# Patient Record
Sex: Male | Born: 1990 | Race: Black or African American | Hispanic: No | Marital: Single
Health system: Western US, Academic
[De-identification: ages and names within clinical notes are randomized; demographics above are authoritative.]

## PROBLEM LIST (undated history)

## (undated) DIAGNOSIS — E119 Type 2 diabetes mellitus without complications: Secondary | ICD-10-CM

## (undated) DIAGNOSIS — F32A Depression, unspecified: Secondary | ICD-10-CM

## (undated) DIAGNOSIS — I4891 Unspecified atrial fibrillation: Secondary | ICD-10-CM

## (undated) MED ORDER — ONDANSETRON 4 MG TAB, RAPID DISSOLVE
4 mg | ORAL_TABLET | ORAL | Status: DC
Start: ? — End: 2013-09-11

---

## 2012-02-19 NOTE — ED Provider Notes (Signed)
HPI Comments: 21yoM presents to the ED via EMS C/O Moderate Left Shoulder Pain associated with Left Shoulder Dislocation. Patient states while playing basketball he came down from a Rebound, was coming down made a manevur around his opponent, attempted to break a forward fall with out stretched Left Arm. Patient notes-" I heard a pop and felt my bone go out of place ." EMS reports on their arrival patient was administered 2mg  Morphine, his Shoulder was reduced on scene. On exam patient denies Head Injury or any other complaints, at this time. He is Right Hand Dominant.     Patient is a 21 y.o. male presenting with shoulder pain and shoulder injury. The history is provided by the patient.   Shoulder Pain     Shoulder Injury   Pertinent negatives include no back pain and no neck pain.        History reviewed. No pertinent past medical history.     History reviewed. No pertinent past surgical history.      History reviewed. No pertinent family history.     History     Social History   ??? Marital Status: SINGLE     Spouse Name: N/A     Number of Children: N/A   ??? Years of Education: N/A     Occupational History   ??? Not on file.     Social History Main Topics   ??? Smoking status: Unknown If Ever Smoked   ??? Smokeless tobacco: Not on file   ??? Alcohol Use: No   ??? Drug Use: No   ??? Sexually Active: Yes     Other Topics Concern   ??? Not on file     Social History Narrative   ??? No narrative on file                  ALLERGIES: Review of patient's allergies indicates no known allergies.      Review of Systems   Constitutional: Negative for fever, chills, diaphoresis and unexpected weight change.   HENT: Negative for ear pain, congestion, sore throat, rhinorrhea, drooling, trouble swallowing, neck pain and tinnitus.    Eyes: Negative for photophobia, pain, redness and visual disturbance.   Respiratory: Negative for cough, choking, chest tightness, shortness of breath, wheezing and stridor.    Cardiovascular: Negative for chest pain,  palpitations and leg swelling.   Gastrointestinal: Negative for nausea, vomiting, abdominal pain, diarrhea, constipation, blood in stool, abdominal distention and anal bleeding.   Genitourinary: Negative for dysuria, urgency, frequency, hematuria, flank pain and difficulty urinating.   Musculoskeletal: Positive for arthralgias. Negative for back pain.   Skin: Negative for color change, rash and wound.   Neurological: Negative for dizziness, seizures, syncope, speech difficulty, light-headedness and headaches.   Psychiatric/Behavioral: Negative for suicidal ideas, hallucinations, behavioral problems, self-injury and agitation. The patient is not hyperactive.        Filed Vitals:    02/19/12 1955 02/19/12 2001   BP: 126/62    Pulse: 91    Temp: 98.2 ??F (36.8 ??C)    Resp: 16    Height:  6' (1.829 m)   Weight:  77.111 kg (170 lb)   SpO2: 99%             Physical Exam   Nursing note and vitals reviewed.  Constitutional: He appears well-developed and well-nourished. No distress.        Right hand dominant male without distress.    HENT:   Head: Atraumatic.  Right Ear: External ear normal.   Mouth/Throat: Oropharynx is clear and moist.   Eyes: Conjunctivae and EOM are normal.   Neck: Normal range of motion. Neck supple. No tracheal deviation present.   Cardiovascular: Normal rate, regular rhythm and normal heart sounds.    Pulmonary/Chest: Effort normal and breath sounds normal. No respiratory distress. He has no wheezes. He has no rales. He exhibits no tenderness.   Abdominal: Soft. Bowel sounds are normal. There is no tenderness. There is no rebound and no guarding.   Musculoskeletal: Normal range of motion. He exhibits tenderness. He exhibits no edema.        Left upper arm: He exhibits tenderness (Bi-cep with palpation and Coracoid Process).        Left Clavicle non-tender.   Lymphadenopathy:     He has no cervical adenopathy.   Neurological: He is alert. No cranial nerve deficit. Coordination normal.   Skin: Skin is  warm and dry. No rash noted. He is not diaphoretic.   Psychiatric: He has a normal mood and affect. His behavior is normal.        MDM     Differential Diagnosis; Clinical Impression; Plan:     Mechanism and exam c/w deranged shoulder and likely dislocation, reduced during immobilization by EMS.  Current films w/o bankhart or Hill sachs deformity.  Sling and release, Sick call and SIQ  Risk of Significant Complications, Morbidity, and/or Mortality:   Presenting problems:  Low  Diagnostic procedures:  Low  Management options:  Low      Procedures    -------------------------------------------------------------------------------------------------------------------   RESULTS:   No Visible fracture  Possible Joint dislocation    PROGRESS NOTES:  10:20 PM  Reviewed x-ray findings. Answered patient questions. He is ready to go home.       SCRIBE ATTESTATION STATEMENT:  20:09   Provider documentation written by: Elvis Coil  Acting as a scribe for Dr. Nada Libman, MD ED Provider     I have reviewed the information recorded by the scribe and agree with its contents.    -------------------------------------------------------------------------------------------------------------------  Diagnosis:   1. Shoulder dislocation          Disposition: home      Follow-up Information     Follow up With Details Comments Contact Info    [facility] SICK CALL in 1 day            Patient's Medications   Start Taking    HYDROCODONE-ACETAMINOPHEN (NORCO) 5-325 MG PER TABLET    Take 1-2 tablets PO every 4-6 hours as needed for pain control.  If over the counter ibuprofen or acetaminophen was suggested, then only take the vicodin for pain not well controlled with the over the counter medication.    METHOCARBAMOL (ROBAXIN) 500 MG TABLET    Take 2 Tabs by mouth four (4) times daily.    NAPROXEN (NAPROSYN) 500 MG TABLET    Take 1 Tab by mouth two (2) times daily (with meals) for 10 days.   Continue Taking    No medications on  file   These Medications have changed    No medications on file   Stop Taking    No medications on file

## 2012-02-19 NOTE — ED Notes (Signed)
I have reviewed discharge instructions with the patient.  The patient verbalized understanding.

## 2012-02-19 NOTE — ED Notes (Signed)
Patient fell and landed on left shoulder while playing basketball. Per rescue, patient's shoulder was dislocated but is "back in." Patient continues to c/o pain to left shoulder.

## 2012-02-20 MED ORDER — ONDANSETRON (PF) 4 MG/2 ML INJECTION
4 mg/2 mL | INTRAMUSCULAR | Status: AC
Start: 2012-02-20 — End: 2012-02-19

## 2012-02-20 MED ORDER — NAPROXEN 500 MG TAB
500 mg | ORAL_TABLET | Freq: Two times a day (BID) | ORAL | Status: AC
Start: 2012-02-20 — End: 2012-02-29

## 2012-02-20 MED ORDER — MORPHINE 4 MG/ML SYRINGE
4 mg/mL | Freq: Once | INTRAMUSCULAR | Status: AC
Start: 2012-02-20 — End: 2012-02-19

## 2012-02-20 MED ORDER — METHOCARBAMOL 500 MG TAB
500 mg | ORAL_TABLET | Freq: Four times a day (QID) | ORAL | Status: DC
Start: 2012-02-20 — End: 2013-09-11

## 2012-02-20 MED ORDER — HYDROCODONE-ACETAMINOPHEN 5 MG-325 MG TAB
5-325 mg | ORAL_TABLET | ORAL | Status: DC
Start: 2012-02-20 — End: 2013-09-11

## 2012-02-20 MED ADMIN — ondansetron (ZOFRAN) 4 mg/2 mL injection: INTRAVENOUS | @ 02:00:00 | NDC 00409475503

## 2012-02-20 MED ADMIN — morphine 4 mg/mL injection: INTRAVENOUS | @ 02:00:00 | NDC 00409189101

## 2012-02-20 MED FILL — MORPHINE 4 MG/ML SYRINGE: 4 mg/mL | INTRAMUSCULAR | Qty: 1

## 2012-02-20 MED FILL — ONDANSETRON (PF) 4 MG/2 ML INJECTION: 4 mg/2 mL | INTRAMUSCULAR | Qty: 2

## 2012-05-03 MED ADMIN — ondansetron (ZOFRAN ODT) tablet 8 mg: ORAL | @ 12:00:00 | NDC 68462015840

## 2012-05-03 MED ADMIN — famotidine (PEPCID) tablet 20 mg: ORAL | @ 12:00:00 | NDC 68084017211

## 2012-05-03 MED FILL — FAMOTIDINE 20 MG TAB: 20 mg | ORAL | Qty: 1

## 2012-05-03 MED FILL — ONDANSETRON 8 MG TAB, RAPID DISSOLVE: 8 mg | ORAL | Qty: 1

## 2012-05-03 NOTE — ED Provider Notes (Signed)
HPI Comments: The patient is a 21 year old male who states he ate [place] food last night, and about an hour later developed nausea, vomiting, slight diarrhea and generalized abdominal discomfort.  He believes this is from the food.  No fevers.  No blood in stool or vomit.  He vomited "maybe 3 times."  No other complaints.     Patient is a 21 y.o. male presenting with vomiting and diarrhea. The history is provided by the patient.   Vomiting   Associated symptoms include diarrhea. Pertinent negatives include no fever and no arthralgias.   Diarrhea   Associated symptoms include diarrhea and vomiting. Pertinent negatives include no fever and no arthralgias.        History reviewed. No pertinent past medical history.     History reviewed. No pertinent past surgical history.      History reviewed. No pertinent family history.     History     Social History   ??? Marital Status: SINGLE     Spouse Name: N/A     Number of Children: N/A   ??? Years of Education: N/A     Occupational History   ??? Not on file.     Social History Main Topics   ??? Smoking status: Unknown If Ever Smoked   ??? Smokeless tobacco: Not on file   ??? Alcohol Use: No   ??? Drug Use: No   ??? Sexually Active: Yes     Other Topics Concern   ??? Not on file     Social History Narrative   ??? No narrative on file                  ALLERGIES: Review of patient's allergies indicates no known allergies.      Review of Systems   Constitutional: Negative for fever.   HENT: Negative for facial swelling.    Eyes: Negative for visual disturbance.   Respiratory: Negative for apnea.    Gastrointestinal: Positive for vomiting and diarrhea.        As above.    Musculoskeletal: Negative for arthralgias.   Skin: Negative for wound.   Neurological: Negative for syncope.   Psychiatric/Behavioral: Negative for confusion.       Filed Vitals:    05/03/12 0722   BP: 119/75   Pulse: 63   Temp: 98.3 ??F (36.8 ??C)   Resp: 16   Height: 5\' 11"  (1.803 m)   Weight: 74.844 kg (165 lb)   SpO2: 99%             Physical Exam   Nursing note and vitals reviewed.  Constitutional: He appears well-developed and well-nourished. No distress.        Looks comfortable, non toxic, well hydrated, answering questions appropriately.    HENT:   Head: Normocephalic and atraumatic.   Eyes: Conjunctivae are normal.   Neck: Neck supple.   Cardiovascular: Normal rate.    Pulmonary/Chest: Effort normal. No stridor.   Abdominal: Soft. Bowel sounds are normal. He exhibits no distension. There is no tenderness. There is no rebound and no guarding.   Musculoskeletal: Normal range of motion.   Neurological: He is alert.   Skin: Skin is warm and dry. He is not diaphoretic.   Psychiatric: He has a normal mood and affect. His behavior is normal. Judgment and thought content normal.        MDM     Differential Diagnosis; Clinical Impression; Plan:     21 year old  male with ? Food related gastritis.  Benign exam and vitals.   Risk of Significant Complications, Morbidity, and/or Mortality:   Presenting problems:  Low  Diagnostic procedures:  Low  Management options:  Low      Procedures    He is feeling better.  His abdomen remains soft and benign.  Vitals normal.  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  We discussed worsening symptoms and the need to return here for any worsening.  He states he understands.

## 2012-05-03 NOTE — ED Provider Notes (Signed)
I was personally available for consultation in the emergency department. I have reviewed the chart prior to the patient's discharge and agree with the documentation recorded by the MLP, including the assessment, treatment plan, and disposition.

## 2012-05-03 NOTE — ED Notes (Signed)
N/v/d since 0330. Patient states ate chinese food last pm.

## 2012-05-03 NOTE — ED Notes (Signed)
Pt resting at this time. Pt c/o n/v.

## 2012-05-03 NOTE — ED Notes (Signed)
I have reviewed discharge instructions with the patient.  The patient verbalized understanding.

## 2013-09-11 MED ORDER — LIDOCAINE-EPINEPHRINE 1 %-1:100,000 IJ SOLN
1 %-:00,000 | Freq: Once | INTRAMUSCULAR | Status: AC
Start: 2013-09-11 — End: 2013-09-11
  Administered 2013-09-11: 22:00:00 via INTRADERMAL

## 2013-09-11 MED ORDER — ONDANSETRON (PF) 4 MG/2 ML INJECTION
4 mg/2 mL | INTRAMUSCULAR | Status: AC
Start: 2013-09-11 — End: 2013-09-11
  Administered 2013-09-11: 22:00:00 via INTRAVENOUS

## 2013-09-11 MED ORDER — HYDROCODONE-ACETAMINOPHEN 5 MG-325 MG TAB
5-325 mg | ORAL_TABLET | ORAL | Status: DC | PRN
Start: 2013-09-11 — End: 2014-02-11

## 2013-09-11 MED ORDER — HYDROMORPHONE (PF) 1 MG/ML IJ SOLN
1 mg/mL | Freq: Once | INTRAMUSCULAR | Status: AC
Start: 2013-09-11 — End: 2013-09-11
  Administered 2013-09-11: 22:00:00 via INTRAVENOUS

## 2013-09-11 MED ORDER — HYDROMORPHONE (PF) 1 MG/ML IJ SOLN
1 mg/mL | Freq: Once | INTRAMUSCULAR | Status: AC
Start: 2013-09-11 — End: 2013-09-11
  Administered 2013-09-11: 23:00:00 via INTRAVENOUS

## 2013-09-11 MED FILL — ONDANSETRON (PF) 4 MG/2 ML INJECTION: 4 mg/2 mL | INTRAMUSCULAR | Qty: 2

## 2013-09-11 MED FILL — LIDOCAINE-EPINEPHRINE 1 %-1:100,000 IJ SOLN: 1 %-:00,000 | INTRAMUSCULAR | Qty: 20

## 2013-09-11 MED FILL — HYDROMORPHONE (PF) 1 MG/ML IJ SOLN: 1 mg/mL | INTRAMUSCULAR | Qty: 1

## 2013-09-11 NOTE — ED Notes (Signed)
I have reviewed discharge instructions with the patient.  The patient verbalized understanding.

## 2013-09-11 NOTE — ED Provider Notes (Signed)
HPI Comments: Franklin Hughes is a 23 y.o. Male who has a PMHx of shoulder dislocations, presents to the ED c/o a left shoulder injury, which occurred while playing basketball just prior to arrival.  The patient c/o sharp left shoulder pain and rated it an 8/10.  The patient has dislocated his shoulder on three prior occasions.    The history is provided by the patient.        Past Medical History   Diagnosis Date   ??? Shoulder dislocation         Past Surgical History   Procedure Laterality Date   ??? Hx fracture tx           History reviewed. No pertinent family history.     History     Social History   ??? Marital Status: SINGLE     Spouse Name: N/A     Number of Children: N/A   ??? Years of Education: N/A     Occupational History   ??? Not on file.     Social History Main Topics   ??? Smoking status: Unknown If Ever Smoked   ??? Smokeless tobacco: Not on file   ??? Alcohol Use: No   ??? Drug Use: No   ??? Sexual Activity: Yes     Other Topics Concern   ??? Not on file     Social History Narrative          ALLERGIES: Review of patient's allergies indicates no known allergies.      Review of Systems   Constitutional: Negative.  Negative for fever, chills and diaphoresis.   HENT: Negative.  Negative for congestion, ear pain, sore throat and trouble swallowing.    Eyes: Negative.  Negative for photophobia, pain, redness and visual disturbance.   Respiratory: Negative.  Negative for cough, chest tightness, shortness of breath and wheezing.    Cardiovascular: Negative.  Negative for chest pain and palpitations.   Gastrointestinal: Negative.  Negative for nausea, vomiting, abdominal pain, diarrhea and blood in stool.   Genitourinary: Negative for dysuria and frequency.   Musculoskeletal: Positive for arthralgias (In left Shoulder). Negative for back pain, joint swelling and neck pain.   Skin: Negative.    Neurological: Negative.  Negative for seizures, syncope and headaches.   Psychiatric/Behavioral: Negative.  Negative for behavioral  problems. The patient is not nervous/anxious.        Filed Vitals:    09/11/13 1609 09/11/13 1611   BP:  126/76   Pulse: 80    Temp: 98.2 ??F (36.8 ??C)    Resp: 18    Height: 5\' 10"  (1.778 m)    Weight: 73.029 kg (161 lb)    SpO2: 98%             Physical Exam   Constitutional: He is oriented to person, place, and time. He appears well-developed and well-nourished. No distress.   HENT:   Head: Normocephalic and atraumatic.   Mouth/Throat: Oropharynx is clear and moist.   Eyes: Conjunctivae are normal. Pupils are equal, round, and reactive to light. No scleral icterus.   Neck: Normal range of motion. Neck supple. No tracheal deviation present.   Cardiovascular: Normal rate, normal heart sounds and intact distal pulses.    Pulmonary/Chest: Effort normal and breath sounds normal. No respiratory distress. He has no wheezes.   Abdominal: Soft. Bowel sounds are normal. He exhibits no distension. There is no tenderness.   Musculoskeletal:        Left  shoulder: He exhibits decreased range of motion (Secondary to pain) and tenderness (To palpation).   Anterior fullness consistent with a dislocation.   Lymphadenopathy:     He has no cervical adenopathy.   Neurological: He is alert and oriented to person, place, and time. No cranial nerve deficit.   Left shoulder is neurovascularly intact.   Skin: Skin is warm and dry. He is not diaphoretic.   Psychiatric: He has a normal mood and affect.   Nursing note and vitals reviewed.       MDM  Number of Diagnoses or Management Options  Shoulder dislocation, left, initial encounter:      Amount and/or Complexity of Data Reviewed  Tests in the radiology section of CPT??: ordered and reviewed    Risk of Complications, Morbidity, and/or Mortality  Presenting problems: high  Diagnostic procedures: high  Management options: high  General comments: After reduction pt nv intact    Patient Progress  Patient progress: stable      Reduction of Joint  Consent: Verbal consent obtained.  Consent given  by: patient  Date/Time: 09/11/2013 4:35 PM  Performed by: attendingPre-proc eval:Immediately prior to the procedure, the patient was reevaluated and found suitable for the planned procedure and any planned medications.  Timeout: Immediately prior to the procedure a time out was called to verify the correct patient, procedure, equipment, staff and marking as appropriate.  Dislocation reduced: left shoulder  Reduction method: traction and counter traction  Post-reduction assessment: distal perfusion intact and neurologic function intact  Procedure: Successful and Confirmed by x-ray  Patient tolerance: Patient tolerated the procedure well with no immediate complications  My total time at bedside, performing this procedure was 1-15 minutes.        -------------------------------------------------------------------------------------------------------------------     EKG INTERPRETATIONS:    NONE    RADIOLOGY RESULTS:   XR SHOULDER LT AP/LAT MIN 2 V      Pre reduction: x-ray shows left shoulder dislocation.    Post Reduction: x-ray shows successful reduction of left shoulder.      ORDERS  Orders Placed This Encounter   ??? JOINT/FRACTURE REDUCTION   ??? XR SHOULDER LT AP/LAT MIN 2 V   ??? XR SHOULDER LT AP/LAT MIN 2 V   ??? SLING & SWATHE   ??? lidocaine-EPINEPHrine (XYLOCAINE) 1 %-1:100,000 injection 100 mg   ??? HYDROmorphone (PF) (DILAUDID) injection 1 mg   ??? ondansetron (ZOFRAN) injection 4 mg   ??? HYDROmorphone (PF) (DILAUDID) injection 0.5 mg   ??? HYDROcodone-acetaminophen (NORCO) 5-325 mg per tablet           LAB RESULTS:   No results found for this or any previous visit (from the past 12 hour(s)).        CONSULTATIONS:    NONE    PROGRESS NOTES:  4:08 PM  Dr. Doristine MangoFrank A Airianna Kreischer, MD reviewed treatment plan with patient. Reviewed medications i.e. bottles provided. Answered his questions.     4:41 PM   Dr. Doristine MangoFrank A Aldine Grainger, MD reduced the patients left shoulder back into place.    5:17 PM  Dr. Doristine MangoFrank A Aletha Allebach, MD let patient know he  was discharging him to his home and discussed medications.    5:28 PM  Nurse put a shoulder immobilizer on patient and they are neurovascularly intact.  Patient is stable and ready for discharge.        ED DIAGNOSES & DISPOSITIONS:   Diagnosis:   1. Shoulder dislocation, left, initial encounter  Disposition: Home    Follow-up Information    Follow up With Details Comments Contact Info    [place] R Manke, MD Call Call on Monday to schedule a follow up appointment. [address]                          c  [facility]  [place] [address]  [phone]            Current Discharge Medication List      START taking these medications    Details   HYDROcodone-acetaminophen (NORCO) 5-325 mg per tablet Take 1 Tab by mouth every four (4) hours as needed for Pain. Max Daily Amount: 6 Tabs.  Qty: 20 Tab, Refills: 0                 SCRIBE ATTESTATION STATEMENT  Documented by: Bishop Limbo (4:08 PM), scribing for and in the presence of Doristine Mango, MD. (4:08 PM)          PROVIDER ATTESTATION STATEMENT  I personally performed the services described in the documentation, reviewed the documentation, as recorded by the scribe in my presence, and it accurately and completely records my words and actions.  Doristine Mango, MD. 6:07 PM    -------------------------------------------------------------------------------------------------------------------

## 2013-09-11 NOTE — ED Notes (Signed)
Shoulder reduced and immobilizer applied. Pt tolerated well.  VSS.  Awaiting paitent ride then discharge.

## 2013-09-11 NOTE — ED Notes (Signed)
Patient arrives via rescue for c/o left shoulder injury s/p hyperextending while playing basketball.  Pt has hx of shoulder dislocation several times in past.

## 2013-09-13 NOTE — ED Provider Notes (Signed)
[facility]  EMER[facility]MENT REPORT  NAME:  Pati GalloJENKINS, Anel  SEX:   M  ADMIT: 09/13/2013  DOB:   [birth year]  MR#    [id number]  ROOM:    TIME DICTATED: 05 34 AM  ACCT#  [id number]        TIME OF SERVICE:  0350    CHIEF COMPLAINT:  Shoulder dislocation.    HISTORY OF PRESENT ILLNESS:  The patient is a 23 year old male who presents complaining of dislocation to  his left shoulder which occurred spontaneously approximately 30 minutes prior  to arrival.  The patient states he awoke from his sleep with severe pain in   his  left shoulder.  He states this happened 3 days ago when he was diagnosed with  left shoulder dislocation at a different ER.  At that time, he had it reset   and  was told to follow up with orthopedics whom he plans to call tomorrow to  schedule an appointment with.  The patient denies any trauma or injury to his  left shoulder.    REVIEW OF SYSTEMS:  CONSTITUTIONAL:  No fever or chills.  MUSCULOSKELETAL:  Left shoulder pain with history of dislocation.  NEUROLOGIC:  Denies sensory or motor symptoms.  CARDIOVASCULAR:  Denies chest pain.    RESPIRATORY:  Denies any shortness of breath.  GASTROINTESTINAL:  Denies nausea, vomiting, abdominal pain.  EARS/NOSE/THROAT:  Denies sore throat or URI symptoms.  SKIN:  Denies rash.  EYES:  No visual symptoms.   HEMATOLOGIC:  No bleeding or bruising issues.  GENITOURINARY:  No dysuria, frequency or urgency.    PAST MEDICAL HISTORY:  History of left shoulder dislocation.    CURRENT MEDICATIONS:  None.    ALLERGIES:  NO KNOWN DRUG ALLERGIES.    SOCIAL HISTORY:  The patient currently uses tobacco.  Admits occasional alcohol use.  Denies  illicit drug use.    FAMILY HISTORY:  Noncontributory.    PHYSICAL EXAMINATION:  VITAL SIGNS:  Blood pressure 117/60, pulse 76, respirations 16, temperature  98.4 orally, pain 9 out of 10, O2 saturation 99% on room air.  GENERAL APPEARANCE:  The patient appears well developed, well nourished.  He   is  alert.   RESPIRATORY:  Clear and equal breath sounds.  No respiratory distress,  tachypnea or accessory muscle use.  CARDIOVASCULAR:  Heart regular without murmurs, gallops, rubs or thrills.  GASTROINTESTINAL:  Abdomen soft, nondistended, nontender to palpation.  HEENT:  Mouth/Throat:  Surfaces of the pharynx, palate, and tongue are pink,  moist, and without lesions.      NECK:  Supple, nontender, symmetrical, no masses or JVD, trachea midline,  thyroid not enlarged, nodular or tender.  LYMPHATIC:  No cervical or submandibular lymphadenopathy palpated.   MUSCULOSKELETAL:  Diffuse tenderness to palpation of left shoulder with   obvious  deformity.  No bony tenderness over the left elbow, left wrist or the left  hand.  Full range of motion of joints are intact.  Radial pulse 2+.  Nailbeds  are pink, prompt capillary refill.  Very limited range of motion of the left  shoulder joint due to patient's complaint of pain.  SKIN:  Area over left upper extremity warm, dry, intact.  No evidence of rash,  erythema or edema.  NEUROLOGIC:  Grip strength and light touch sensation of upper extremities  intact and symmetric bilaterally.    INITIAL IMPRESSION:  This is a 23 year old male here for evaluation of left shoulder pain with  obvious deformity and recent history of spontaneous shoulder dislocation.  We  will obtain x-rays.  Otherwise neurovascularly service.  We will establish IV  and medicate patient symptomatically for his pain.    CONTINUED BY Clayvon Parlett Consuella Lose, MD:      INITIAL ASSESSMENT:    A 23 year old male presents to the ER today with shoulder dislocation, x-ray  with confirmation.  We are going to perform conscious sedation and reduce the  shoulder.    COURSE IN THE EMERGENCY DEPARTMENT:  The patient was premedicated with propofol 80 mg in divided doses.  He was  previously medicated with 6 mg morphine for pain.  Using traction,  counter-traction after appropriate sedation the patient's shoulder was   relocated without difficulty.  This was confirmed with postprocedural x-ray.   The patient tolerated the procedure well.  Post procedural sedation total time  less than 10 minutes.    DIAGNOSIS:  Shoulder dislocation status post reduction.      The patient was discharged home in stable condition.      ___________________  Candace Cruise MD  Dictated By: Truddie Crumble Cathlyn Parsons, PA-C    My signature above authenticates this document and my orders, the final  diagnosis (es), discharge prescription (s), and instructions in the [facility]  Pul[facility]ck record.  Nursing notes have been reviewed by the physician/mid-level provider.    If you have any questions please contact [phone].      D:09/13/2013 05:34:55  T: 09/13/2013 16:48:54  [id number]  Authenticated and Edited by Lyman Speller Luciano Cutter, M.D. On 09/17/13 11:48:33 AM

## 2013-09-15 NOTE — ED Provider Notes (Signed)
[facility]  EMERGENCY DEPARTMENT TREATMENT REPORT  NAME:  Franklin GalloJENKINS, Willem  SEX:   M  ADMIT: 09/14/2013  DOB:   1990-11-25  MR#    [id number]  ROOM:    TIME DICTATED: 08 03 PM  ACCT#  [id number]        TIME OF EVALUATION:  1904    CHIEF COMPLAINT:    I don't have my medication.    HISTORY OF PRESENT ILLNESS:  This 23 year old male who has a history of left shoulder dislocation was seen   here early on 02/09 after dislocated in his sleep.  It was reduced here.  Has   been in a sling.  He states he presented his paperwork and medications and   everything to his command and they apparently left today, taking his Percocet   prescription with them.  They will not be back for several days and he is in   pain.  He denies any interim injury or trauma.  He already has an appointment   to follow up with command on Friday for further evaluation of his left   shoulder.    REVIEW OF SYSTEMS:  CONSTITUTIONAL:  No fever, chills, or weight loss.  ENT:  No sore throat, runny nose, or other URI symptoms.  RESPIRATORY:  No cough, shortness of breath, or wheezing.  CARDIOVASCULAR:  No chest pain, chest pressure, or palpitations.  GASTROINTESTINAL:  No vomiting, diarrhea, or abdominal pain.  MUSCULOSKELETAL:  Positive for left shoulder pain.  NEUROLOGICAL:  No headaches, sensory or motor symptoms.  Denies complaints in all other systems.    PAST MEDICAL HISTORY:  History of shoulder dislocation going back 2013 when he was playing   basketball.  He has had several dislocation since that time.  Most recently   was seen here yesterday to have it reduced.    MEDICATIONS:  He was given a Percocet prescription which he does not have.    ALLERGIES:  NONE KNOWN.    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    PHYSICAL EXAMINATION:  GENERAL:  A well-developed male.  VITAL SIGNS:  Blood pressure 112/78, pulse 73, respiration 14, temperature   99.4, O2 sats 98% on room air.  LUNGS:  Clear to auscultation.   HEART:  Has a regular rate and rhythm.  EXTREMITIES:  His left shoulder has a sling on.  The shoulder appears to be in   place.  That extremity is well perfused, warm and dry.  He has good radial   pulse, good capillary refill.  Sensation is intact.  He is able to wiggle his   fingers.  Right upper extremity is atraumatic.  Lower extremities warm and   dry.    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  At this   time, we will refill Percocet prescription.  I did discuss that we would not   be able to do this in the future.  I have given ibuprofen, have him use   ibuprofen as well.  Follow up with his Command as planned for further   evaluation of the shoulder.  Certainly seek medical attention if worsening or   new concerns.    FINAL DIAGNOSES:  Medication refill, history of left shoulder dislocation.    DISPOSITION AND PLAN:  The patient  discharged home in stable condition to follow up  as above.  The   patient was examined by myself and Dr. Carolin Guernsey, who agrees with   the above   assessment and plan.      ___________________  Liberty Handy Himmel-Madelline Eshbach DO  Dictated By: Maurice Small. Williams Che, [place]    My signature above authenticates this document and my orders, the final  diagnosis (es), discharge prescription (s), and instructions in the [facility]   Pulsecheck record.  Nursing notes have been reviewed by the physician/mid-level provider.    If you have any questions please contact [phone].    MLT  D:09/14/2013 20:03:34  T: 09/15/2013 09:31:02  [id number]  Authenticated by Carolin Guernsey, DO On 09/18/2013 05:05:32 PM

## 2014-02-11 MED ORDER — MIDAZOLAM 1 MG/ML IJ SOLN
1 mg/mL | INTRAMUSCULAR | Status: AC
Start: 2014-02-11 — End: 2014-02-11
  Administered 2014-02-11: via INTRAVENOUS

## 2014-02-11 MED ORDER — FENTANYL CITRATE (PF) 50 MCG/ML IJ SOLN
50 mcg/mL | Freq: Once | INTRAMUSCULAR | Status: AC
Start: 2014-02-11 — End: 2014-02-11
  Administered 2014-02-11: via INTRAVENOUS

## 2014-02-11 MED ORDER — MORPHINE 4 MG/ML SYRINGE
4 mg/mL | INTRAMUSCULAR | Status: AC
Start: 2014-02-11 — End: 2014-02-11
  Administered 2014-02-11: 23:00:00 via INTRAVENOUS

## 2014-02-11 MED ADMIN — sodium chloride 0.9 % bolus infusion 1,000 mL: INTRAVENOUS | @ 23:00:00 | NDC 00409798309

## 2014-02-11 MED FILL — SODIUM CHLORIDE 0.9 % IV: INTRAVENOUS | Qty: 1000

## 2014-02-11 MED FILL — MORPHINE 4 MG/ML SYRINGE: 4 mg/mL | INTRAMUSCULAR | Qty: 1

## 2014-02-11 NOTE — ED Notes (Signed)
Pt slightly sleepy but awake and back at his baseline, talking with girlfriend at bedside. Currently on room air; vitals are stable; see flowsheet. Shoulder immobilizer remains in place. Will continue to closely monitor pt. Call light within reach.

## 2014-02-11 NOTE — ED Notes (Signed)
Pt states he was playing basketball when someone came down on his left arm dislocating his left shoulder. Obvious deformity noted to left shoulder.  Pt given 10 mg Morphine IV by EMS

## 2014-02-11 NOTE — ED Notes (Signed)
Took report from The TJX CompaniesCharmaine RN, now assuming care of pt at this time. Pt relaxing in stretcher, reports pain 8/10. MD here now to see pt. Provided pt w/ pillow for further support/comfort of his shoulder. Awaiting XR.

## 2014-02-11 NOTE — ED Notes (Signed)
Pt taken to x-ray via stretcher.

## 2014-02-11 NOTE — ED Provider Notes (Signed)
HPI Comments: Franklin Hughes is a 23 y.o. Male with a Hx of Left shoulder dislocations who was brought in by EMS to the ED complaining of desolation to left shoulder that occurred just prior to arrival after playing basket ball and having his arm hit while reaching up. Relates his pain to previous dislocations and there is an obvious deformity to the shoulder. Denies numbness, tingling or any other injuries. Pt given 10 mg Morphine IV by EMS and states his pain is returning. He states that he is having surgery on his shoulder in 5 days with Dr Celine Ahr.     Patient is a 23 y.o. male presenting with shoulder injury. The history is provided by the patient and the EMS personnel.   Shoulder Injury   The problem has not changed since onset.Pertinent negatives include no numbness, no back pain and no neck pain.        Past Medical History   Diagnosis Date   ??? Shoulder dislocation    ??? Shoulder dislocation, recurrent      left side         Past Surgical History   Procedure Laterality Date   ??? Hx fracture tx           History reviewed. No pertinent family history.     History     Social History   ??? Marital Status: SINGLE     Spouse Name: N/A     Number of Children: N/A   ??? Years of Education: N/A     Occupational History   ??? Not on file.     Social History Main Topics   ??? Smoking status: Current Every Day Smoker -- 0.25 packs/day   ??? Smokeless tobacco: Not on file   ??? Alcohol Use: Yes      Comment: social ly   ??? Drug Use: No   ??? Sexual Activity: Yes     Other Topics Concern   ??? Not on file     Social History Narrative                  ALLERGIES: Review of patient's allergies indicates no known allergies.      Review of Systems   Constitutional: Negative for fever and chills.   Respiratory: Negative for chest tightness and shortness of breath.    Cardiovascular: Negative for chest pain and palpitations.   Gastrointestinal: Negative for nausea, vomiting, diarrhea and constipation.    Musculoskeletal: Negative for back pain and neck pain.        Left shoulder pain   Neurological: Negative for weakness and numbness.   All other systems reviewed and are negative.      Filed Vitals:    02/11/14 2115 02/11/14 2116 02/11/14 2130 02/11/14 2145   BP: 109/72 109/72 108/64 102/66   Pulse: 70 63 77 79   Temp:       Resp: 15 18 15 14    Height:       Weight:       SpO2: 100% 100% 99% 98%            Physical Exam   Constitutional: He is oriented to person, place, and time. He appears well-developed and well-nourished. He appears distressed.   HENT:   Head: Normocephalic and atraumatic.   Mouth/Throat: Oropharynx is clear and moist.   Eyes: Conjunctivae are normal.   Neck: Full passive range of motion without pain. Neck supple. No tracheal deviation present.   Cardiovascular: Normal  rate, regular rhythm, normal heart sounds and intact distal pulses.    Pulmonary/Chest: Effort normal and breath sounds normal. No respiratory distress. He has no wheezes.   Abdominal: Bowel sounds are normal.   Musculoskeletal: Normal range of motion. He exhibits no edema.   Obvious deformity to left shoulder. Good strength, sensation and cap refill in left hand. Equal radial pulses.    Neurological: He is alert and oriented to person, place, and time. No cranial nerve deficit or sensory deficit.   Skin: Skin is warm, dry and intact. He is not diaphoretic.   Psychiatric: He has a normal mood and affect.   Nursing note and vitals reviewed.       MDM  Number of Diagnoses or Management Options  Shoulder dislocation, left, initial encounter:   Diagnosis management comments: Dislocated left shoulder    Pt scheduled for ortho surgery for recurrent dislocation    Will do reduction moderated sedation    9:20PM  Pt awake, feels much better, VSS, on monitor    10:31PM  Pt sitting up talking with girlfriend who will take him home. Pt AOx4, VSS.     Will dc home with Rx norco, motrin, is in sling.     Pre and post sling: NV intact     Told him keep surgery appt without fail. He agrees.        Amount and/or Complexity of Data Reviewed  Tests in the radiology section of CPT??: ordered and reviewed  Tests in the medicine section of CPT??: ordered and reviewed  Review and summarize past medical records: yes    Risk of Complications, Morbidity, and/or Mortality  Presenting problems: moderate  Diagnostic procedures: moderate  Management options: high    Critical Care  Total time providing critical care: < 30 minutes    Patient Progress  Patient progress: improved      Reduction of Joint  Consent: Verbal consent obtained. Written consent obtained.  Consent given by: patient  Date/Time: 02/11/2014 8:35 PM  Performed by: attendingPre-proc eval:Immediately prior to the procedure, the patient was reevaluated and found suitable for the planned procedure and any planned medications.  Timeout: Immediately prior to the procedure a time out was called to verify the correct patient, procedure, equipment, staff and marking as appropriate.  Dislocation reduced: left shoulder  Reduction method: direct traction, external rotation and traction and counter traction (counter traction)  Post-reduction assessment: neurologic function intact, distal perfusion intact, neurologic function unchanged and distal perfusion unchanged  Procedure: Successful and Confirmed by x-ray  Patient tolerance: Patient tolerated the procedure well with no immediate complications  My total time at bedside, performing this procedure was 1-15 minutes.  Procedural Sedation  Consent: Verbal consent obtained.  Consent given by: patient  Date/Time: 02/11/2014 8:35 PM  Performed by: attendingPre-procedure re-eval: Immediately prior to the procedure, the patient was reevaluated and found suitable for the planned procedure and any planned medications.  Time out: Immediately prior to the procedure a time out was called to verify the correct patient, procedure, equipment, staff and marking as appropriate..   Indications:joint reduction  Expected sedation level:level 2-1: moderate/analgesia (conscious sedation)  Asa classification: 1 - normal healthy patient  Mallampati score: I - soft palate, uvula, fauces, tonsillar pillars visible  Airway: Can hyperextend neck and maintain airway  Sedation monitoring: heart rate, cardiac monitor, continuous pulse ox and IV access  Patient status: sedated  Preoxygenation: nasal cannula  Pre-treatment meds: fentanyl  Sedation: fentanylSedation response: vital signs stable, O2 sats  greater than 92% and airway patent  Complications: no complications  Post procedure status: sleepy and responds to verbal stimuli  Patient tolerance: Patient tolerated the procedure well with no immediate complications  My total time at bedside, performing this procedure was 1-15 minutes.        PROGRESS NOTES  7:11 PM: Ewing Schlein, DO arrives to the bedside to evaluate the patient. Answered the patient's questions regarding the treatment plan.  10:00 PM: Pt is feeling much better and is instructed to keep his arm immobilizied in the splint.       CONSULTATIONS  None    ED PHYSICIAN ORDERS  Orders Placed This Encounter   ??? JOINT/FRACTURE REDUCTION     This order was created via procedure documentation     Standing Status: Standing      Number of Occurrences: 1      Standing Expiration Date:    ??? MODERATE SEDATION     This order was created via procedure documentation     Standing Status: Standing      Number of Occurrences: 1      Standing Expiration Date:    ??? XR SHOULDER LT AP/LAT MIN 2 V     Standing Status: Standing      Number of Occurrences: 1      Standing Expiration Date:      Order Specific Question:  Transport     Answer:  Doctor, general practice [5]     Order Specific Question:  Reason for Exam     Answer:  Pain   ??? XR SHOULDER LT AP/LAT MIN 2 V     Standing Status: Standing      Number of Occurrences: 1      Standing Expiration Date:      Order Specific Question:  Transport     Answer:  No Transport [3]      Order Specific Question:  Reason for Exam     Answer:  post-reduction   ??? CARDIAC MONITORING     Standing Status: Standing      Number of Occurrences: 1      Standing Expiration Date:      Order Specific Question:  Type:     Answer:  Bedside   ??? SALINE LOCK IV ONE TIME STAT     Standing Status: Standing      Number of Occurrences: 1      Standing Expiration Date:    ??? morphine injection 4 mg     Sig:    ??? sodium chloride 0.9 % bolus infusion 1,000 mL     Sig:    ??? fentaNYL citrate (PF) injection 150 mcg     Sig:    ??? midazolam (VERSED) injection 4 mg     Sig:    ??? naloxone (NARCAN) 1 mg/mL injection     Sig:      DEKRONE, DIANE: cabinet override   ??? flumazenil (ROMAZICON) 0.1 mg/mL injection     Sig:      DEKRONE, DIANE: cabinet override   ??? HYDROcodone-acetaminophen (NORCO) 5-325 mg per tablet     Sig: Take 1 Tab by mouth every six (6) hours as needed for Pain. Max Daily Amount: 4 Tabs.     Dispense:  14 Tab     Refill:  0   ??? ibuprofen (MOTRIN) 800 mg tablet     Sig: Take 1 Tab by mouth every six (6) hours as needed for Pain.     Dispense:  20 Tab     Refill:  0         MEDICATIONS ORDERED  Medications   naloxone (NARCAN) 1 mg/mL injection (0 mg  Held 02/11/14 2000)   flumazenil (ROMAZICON) 0.1 mg/mL injection (  Held 02/11/14 2000)   morphine injection 4 mg (4 mg IntraVENous Given 02/11/14 1903)   sodium chloride 0.9 % bolus infusion 1,000 mL (0 mL IntraVENous IV Completed 02/11/14 1928)   fentaNYL citrate (PF) injection 150 mcg (100 mcg IntraVENous Given 02/11/14 1950)   midazolam (VERSED) injection 4 mg (2 mg IntraVENous Given 02/11/14 1950)         RADIOLOGY INTERPRETATIONS  XR SHOULDER LT AP/LAT MIN 2 V        Impression Read by Dr Kristeen Mansews:  Left shoulder anterior dislocation    Status Post Reduction:  Shoulder is in correct alignment no fracture present.            EKG READINGS/LABORATORY RESULTS  No results found for this or any previous visit (from the past 12 hour(s)).       ED DIAGNOSIS & DISPOSITION INFORMATION  Diagnosis:   1. Shoulder dislocation, left, initial encounter          Disposition: Discharged    Follow-up Information    Follow up With Details Comments Contact Info    Thomasene Lothristopher Ingard Ellingson, MD  Keep your appointment with Dr Celine AhrEllingson without fail.  [address]  [place] [id number]  [phone]            Current Discharge Medication List      START taking these medications    Details   HYDROcodone-acetaminophen (NORCO) 5-325 mg per tablet Take 1 Tab by mouth every six (6) hours as needed for Pain. Max Daily Amount: 4 Tabs.  Qty: 14 Tab, Refills: 0      ibuprofen (MOTRIN) 800 mg tablet Take 1 Tab by mouth every six (6) hours as needed for Pain.  Qty: 20 Tab, Refills: 0               SCRIBE ATTESTATION STATEMENT  Documented by: Linard MillersKelsey Boyd, scribing for and in the presence of Ewing SchleinAdrian C Kathlyne Loud, DO. (7:11 PM)    PROVIDER ATTESTATION STATEMENT  I personally performed the services described in the documentation, reviewed the documentation, as recorded by the scribe in my presence, and it accurately and completely records my words and actions.  Truett MainlandAdrian C Naydelin Ziegler, DO.

## 2014-02-11 NOTE — ED Notes (Signed)
Bedside and Verbal shift change report given to Ericka,RN (Cabin crew) by Gala Romney (offgoing nurse). Report included the following information SBAR, ED Summary and MAR.

## 2014-02-11 NOTE — ED Notes (Signed)
Patient discharged with ride home. Ambulatory without difficulty. Alert and oriented x 3. Respirations unlabored. Skin warm and dry. I have reviewed discharge instructions with the patient and spouse.  The patient and spouse verbalized understanding.

## 2014-02-12 MED ORDER — IBUPROFEN 800 MG TAB
800 mg | ORAL_TABLET | Freq: Four times a day (QID) | ORAL | Status: DC | PRN
Start: 2014-02-12 — End: 2014-02-11

## 2014-02-12 MED ORDER — HYDROCODONE-ACETAMINOPHEN 5 MG-325 MG TAB
5-325 mg | ORAL_TABLET | Freq: Four times a day (QID) | ORAL | Status: DC | PRN
Start: 2014-02-12 — End: 2014-02-11

## 2014-02-12 MED ORDER — OXYCODONE-ACETAMINOPHEN 5 MG-325 MG TAB
5-325 mg | ORAL_TABLET | Freq: Four times a day (QID) | ORAL | Status: AC | PRN
Start: 2014-02-12 — End: ?

## 2014-02-12 MED FILL — NALOXONE 1 MG/ML IJ SYRG(AKA NARCAN): 1 mg/mL | INTRAMUSCULAR | Qty: 2

## 2014-02-12 MED FILL — MIDAZOLAM 1 MG/ML IJ SOLN: 1 mg/mL | INTRAMUSCULAR | Qty: 4

## 2014-02-12 MED FILL — FENTANYL (PF) 100 MCG/2 ML (50 MCG/ML) INTRAVENOUS SYRINGE: 100 mcg/2 mL (50 mcg/mL) | INTRAVENOUS | Qty: 4

## 2018-04-21 ENCOUNTER — Emergency Department
Admission: EM | Admit: 2018-04-21 | Discharge: 2018-04-21 | Disposition: A | Discharge status: Home / Self Care | Payer: Self-pay | Attending: Emergency Medicine | Admitting: Emergency Medicine

## 2018-04-21 DIAGNOSIS — H7392 Unspecified disorder of tympanic membrane, left ear: Secondary | ICD-10-CM | POA: Insufficient documentation

## 2018-04-21 DIAGNOSIS — H9202 Otalgia, left ear: Principal | ICD-10-CM | POA: Insufficient documentation

## 2018-04-21 DIAGNOSIS — H9212 Otorrhea, left ear: Secondary | ICD-10-CM | POA: Insufficient documentation

## 2018-04-21 NOTE — ED Notes (Signed)
Pt given discharge instructions and verbalized understanding with all questions answered prior to discharge. Pt aox4, ambulatory with steady gait. No distress noted. Vitals stable upon discharge.

## 2018-04-21 NOTE — ED Notes (Signed)
Dr Corbett at bedside

## 2018-04-21 NOTE — Discharge Instructions (Signed)
Please make a follow-up appointment with the ear nose and throat specialist to have your ear further evaluated.       Otalgia, NOS    You have been seen today for ear pain.    The medical term for ear pain is "otalgia." The cause of your ear pain does not seem to be a typical ear infection. When examining your ear, the doctor did not see the usual eardrum redness or other signs often seen with an ear infection.    The exact cause of your ear pain is not known at this time. However, the doctor who cared for you today feels it is OK for you to go home.    Some causes of ear pain (aside from infection) are:   Eustachian Tube Dysfunction: The eustachian tube connects the sinuses with the middle ear. It serves to equalize (balance) pressure. The eustachian tube can get swollen and blocked off, usually from an upper respiratory infection (a head cold). When this happens, air may get trapped behind the eardrum, causing pressure and pain. Sometimes medicines like oral decongestants like pseudoephedrine (Sudafed) and nasal sprays like phenylephrine (Neo-Synephrine) or oxymetazoline (Afrin) can help shrink the swelling and relieve pressure.   Referred Pain: Because of the way the nerves in the head are arranged, the pain you feel in your ear may be coming from another place. Dental pain from cavities or tooth infections may be felt in the ear. Pain that starts in the jaw joint (the temporo-mandibular joint or "TMJ" may be felt in the ear. Head and neck infections may cause a lymph node (bumps found in the body that are part of the immune system) near the ear to get swollen and painful. Sore throats (from strep throat, "mono" and other throat infections) may cause pain to travel to the ear.   Tumors: A rare, but serious, cause of ear pain and pressure may be from a tumor (mass) pressing on the nerves that give feeling to the head and face. Symptoms from tumors often start slowly and get worse with time. They may also  cause pain or loss of function in other parts of the face. If the ear pain does not go away or gets worse over the next week, get checked again.    It is possible you have an infection but it was too early to be diagnosed during your visit. If you develop more ear infection symptoms, get checked again by your regular doctor or here or at the nearest Emergency Department.   Some middle ear infection (otitis media) symptoms are: Fever (temperature higher than 100.62F / 38C) and ear pain. There may also be a feeling of fullness in the ear or decreased hearing. If the eardrum ruptures (splits), there may also be drainage from the ear.   Some symptoms of an external ear infection affecting the ear canal only (otitis externa) are: Fullness in the ear, itching, ear pain and cheesy drainage. There may be tenderness (pain) when the ear is pulled.   It is VERY IMPORTANT to follow up with your doctor. This is to have additional testing. It is also to have another examination to try to find out the cause of your pain.    YOU SHOULD SEEK MEDICAL ATTENTION IMMEDIATELY, EITHER HERE OR AT THE NEAREST EMERGENCY DEPARTMENT, IF ANY OF THE FOLLOWING OCCURS:   You do not get better, despite treatment.   Symptoms become worse.   A severe headache, stiff neck, lethargy (excessive fatigue and  sleepiness) or confusion develops.

## 2018-04-21 NOTE — ED Notes (Signed)
Pt c/o left ear pain x1-2wks, no illness or fevers reported. Pt denies trauma. Pt reports has seasonal congestion. Pt aox4 ambulatory with steady gait.

## 2018-04-21 NOTE — ED Provider Notes (Signed)
Emergency Department Provider Note    Christopher [name]  MRN: [id number]  DOB: 1990-10-21    The Date of Service for the Emergency Room encounter is 04/21/2018  8:15 PM     History obtained from patient  Chief Complaint:  Chief Complaint   Patient presents with   . Ear Pain     left ear pain and "fullness" x2wks. pt denies fevers, illness or trauma.       HPI:  Christopher [name] is a 27 year old male w/ no reported past medical history presents with left ear pain. Patient reports initially had right ear pain and fullness, went to outpatient hospital, had a hydrogen peroxide and irrigation, large amount a wax removed and patient reports symptoms improved.  Two weeks ago started having left ear fullness.  Again went to the hospital, at this point did not have any hydrogen peroxide but was irrigated and then physician attempted scraping wax out of the ear.  Patient reports he had sudden onset of pain at that time.  Patient reports today he has had difficulty hearing, intermittent fullness when he yawns.  Did notice some brown drainage from the ear.  No fevers or chills, no significant pain. Has not been on any abx. Was told his TM looked "weird" but not to be concerned and referred to ENT but has not made appt yet.         Patient's medical history has been reviewed today as available in EPIC chart.    ROS:   Constitutional: No Fever  Eyes: No vision changes, No discharge  ENT:  No sore throat, + Lear fullness  Cardiovascular: No Chest pain  Respiratory: No cough, No SOB   GI: No abd pain, No nausea, No vomiting  MSK: No joint pain   Skin: No Rash   Neuro: No headache       Home Medications:  None       Allergies:   Patient has no known allergies.    Past Medical History:   No past medical history on file.    Past Surgical History:   No past surgical history on file.    Family History:  No family history on file.    Social History:   Denies drug use    PHYSICAL EXAM  Vitals:    04/21/18 2020 04/21/18 2029   BP:  (!) 178/106    Pulse: 100    Resp: 20    Temp: 98.5 F (36.9 C)    SpO2: 99%    Weight:  181.4 kg (400 lb)   Height: 6\' 1"  (1.854 m)      Vital Signs reviewed  SpO2 measured to be 99% and interpreted as wnl    Gen: alert, non-toxic appearing,  NAD, very well appearing  HEENT:Atraumatic, EOMI, MMM, L TM with scant brown drainage,  No mastoid ttp, no pain with ear manipulation, TM defect but without clear perforation  Neck: full ROM   CV: Reg rate  Pulm:  non-labored breathing   Back:no CVA tenderness  MSK: MAEx4  Neuro: Alert, gait normal, no ataxia, CN 2-12 intact        Medical Decision-Making & ED Course  27 year old male w./ unremarkable pmh p/w L ear fullness that is intermittent. Pt is very well appearing, exam as noted above, no clear s/s of infection including no e/o mastoiditis, otitis externa/media, however TM does have a defect, unclear if this could have occurred during prior  irrigation of ear. Ear canal does look irritated. Recommend f/u with ENT, referral provided. S/s of infection discuss for which pt voiced understanding, will return if these symptoms develop.     Plan:  Ent referral  Return precautions     Patient seen and discussed with ED attending, Corbett, Gerrit Halls, *.            Blima Singer, MD  Resident  04/23/18 1005       Corbett, Gerrit Halls, MD  04/30/18 1150

## 2018-04-22 ENCOUNTER — Telehealth: Payer: Self-pay

## 2018-04-22 NOTE — Telephone Encounter (Signed)
Per pt mom want to schedule an appt today if possible from a STAT referral, he got discharge yesterday from the ED . Pt does not have insurance. Mom Is aware of the self pay fee and facility fee if in [facility].    Diagnosis : L eye pain, abrnormal TM    Please assist thank you  [phone]

## 2018-04-23 NOTE — Telephone Encounter (Signed)
I called the patient over the phone. He reports he went to the ER for left ear pain, and was not given any treatment. He was then told to follow up with ENT specialist to further assess his TM. The patient also reports he feels as though his left ear is "stuffed up" and has experiences some mild decreased hearing. This morning he noticed some drainage on the his pillow case on the left side, unsure if this was ear wax.     I advise the patient he will need to be scheduled for an audiogram and a consultation with one of our ENTs. Patient verbalized understanding. He was offered an audiogram appointment tomorrow at 01:00 PM, and possibly an appointment in clinic. Patient is in the process of applying for [employer] and mentions he will call our office back to confirm his availability for tomorrow.

## 2018-04-23 NOTE — Telephone Encounter (Signed)
Yes absolutely needs audiogram

## 2018-04-29 NOTE — Telephone Encounter (Signed)
Message forwarded to [place], [place] to assit with scheduling patient an appointment with audiology and evaluation.

## 2018-05-18 NOTE — Telephone Encounter (Signed)
Called patient twice, phone went straight to voicemail. Left message to schedule audiogram.

## 2020-04-11 IMAGING — MR MRI SHOULDER LT WO CONTRAST
4 series · 34 of 40 positions shown · non-contrast
Comparison: None available.

INDICATION: Instability of left shoulder joint.
TECHNIQUE: Multiplanar, multisequence MR imaging of the left shoulder without contrast.

[Series 5: t2_axial_fs · axial · left · 3.0mm · 0.47mm/px · z∈[+1,+97]mm · 9 of 25 slices shown]
[im 1/25]
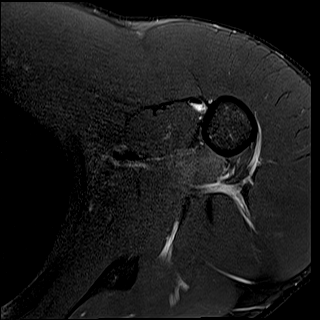
[im 4/25]
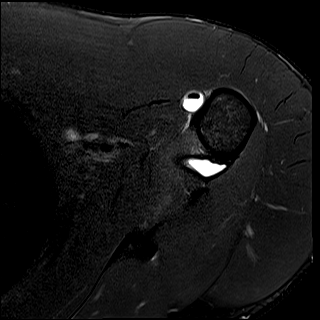
[im 7/25]
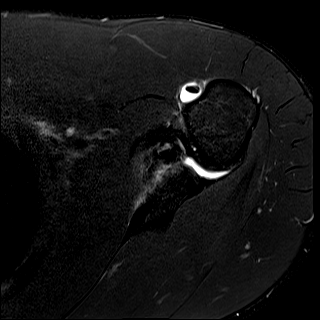
[im 10/25]
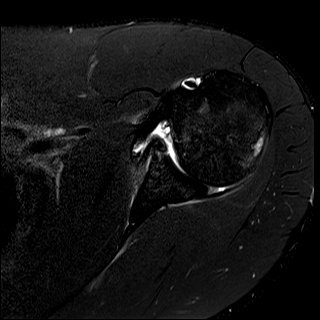
[im 13/25]
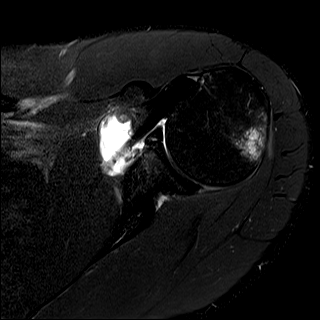
[im 16/25]
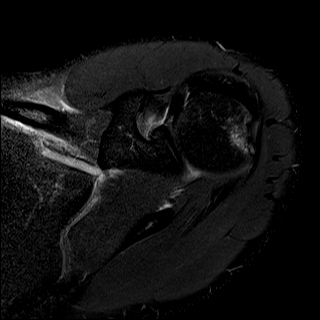
[im 19/25]
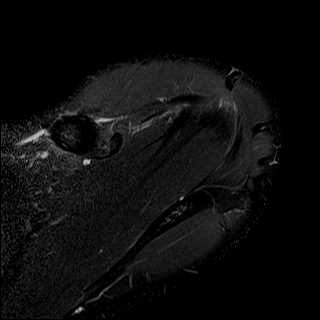
[im 22/25]
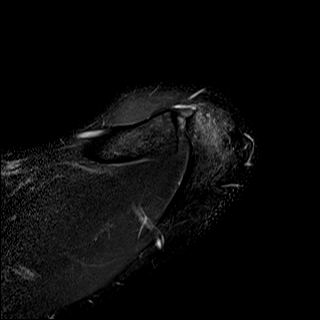
[im 25/25]
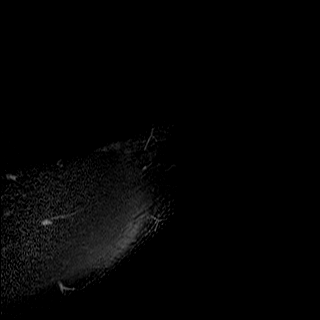

[Series 6: t2_cor_fs · coronal · left · 3.0mm · 0.50mm/px · 9 of 25 slices shown]
[im 1/25]
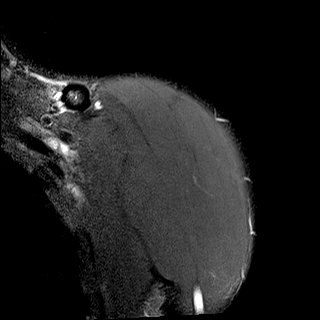
[im 4/25]
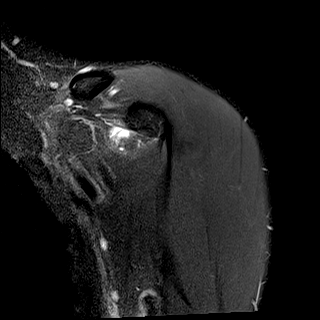
[im 7/25]
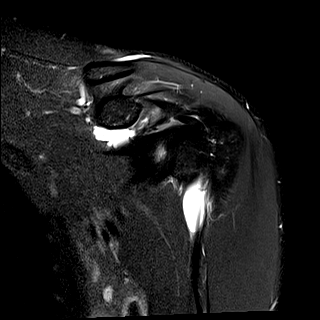
[im 10/25]
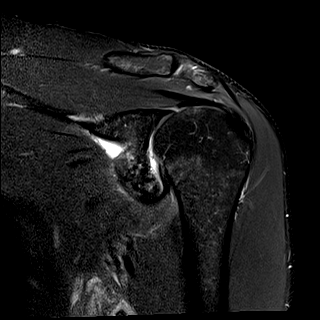
[im 13/25]
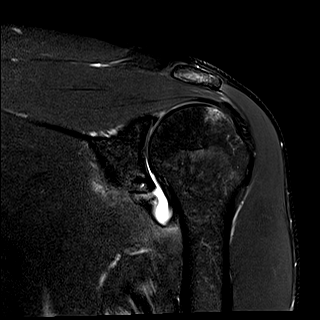
[im 16/25]
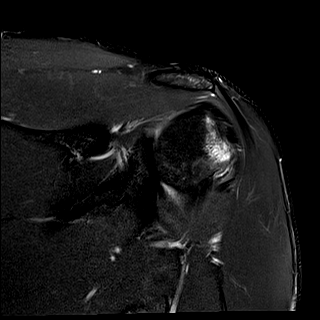
[im 19/25]
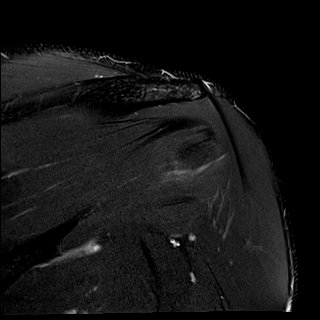
[im 22/25]
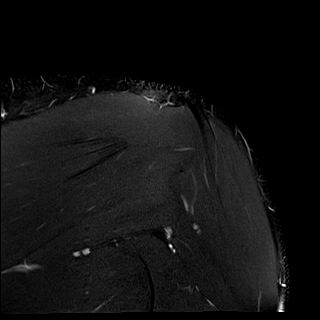
[im 25/25]
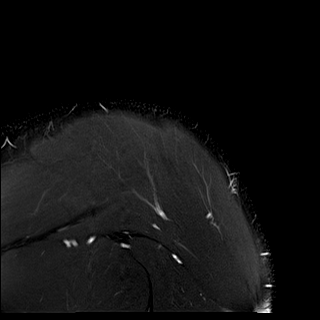

[Series 8: t1_sag · sagittal · left · 3.0mm · 0.47mm/px · 11 of 28 slices shown]
[im 1/28]
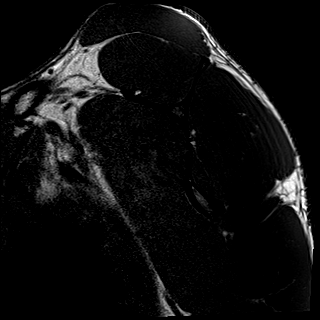
[im 3/28]
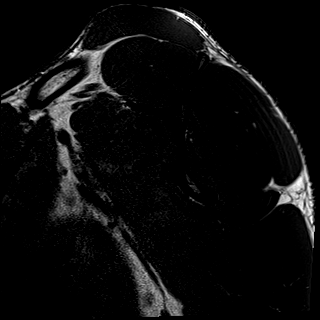
[im 6/28]
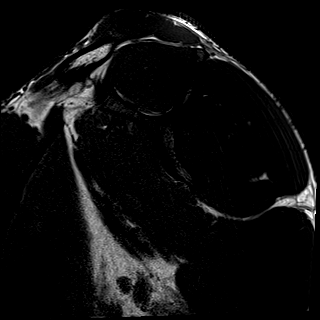
[im 9/28]
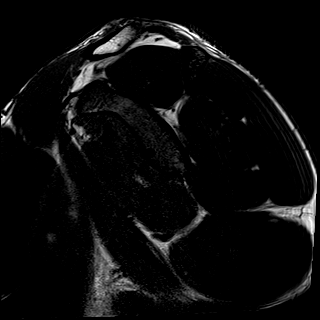
[im 11/28]
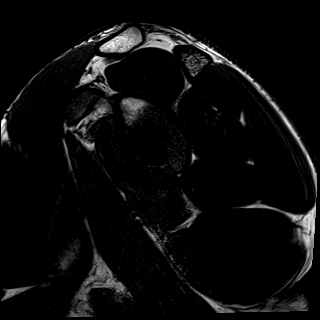
[im 14/28]
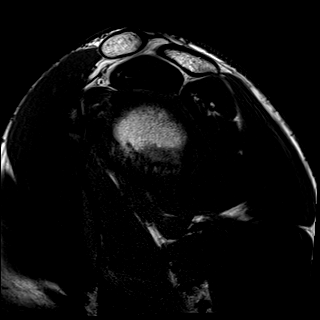
[im 17/28]
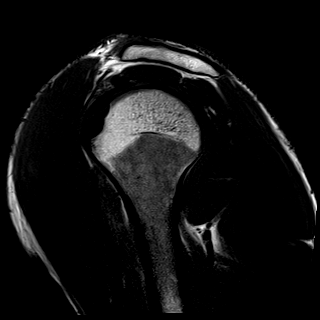
[im 19/28]
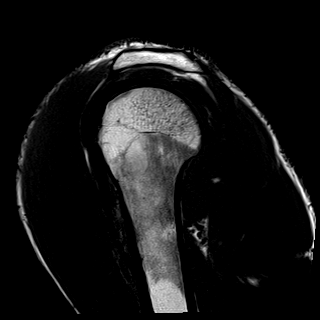
[im 22/28]
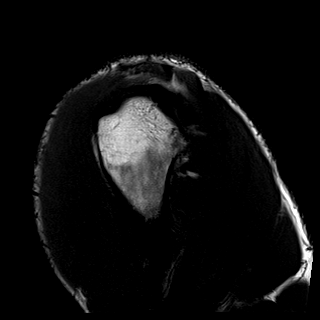
[im 25/28]
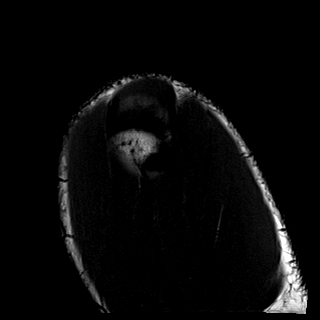
[im 28/28]
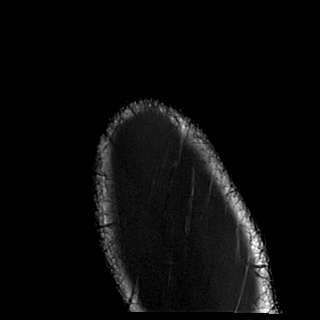

[Series 9: t2_sag_fs · sagittal · left · 3.0mm · 0.31mm/px · 5 of 28 slices shown]
[im 1/28]
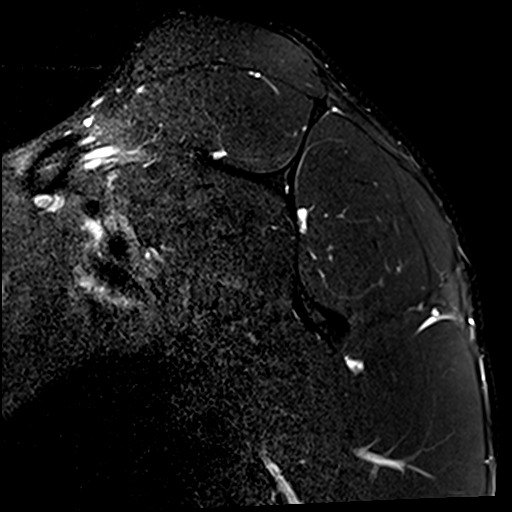
[im 3/28]
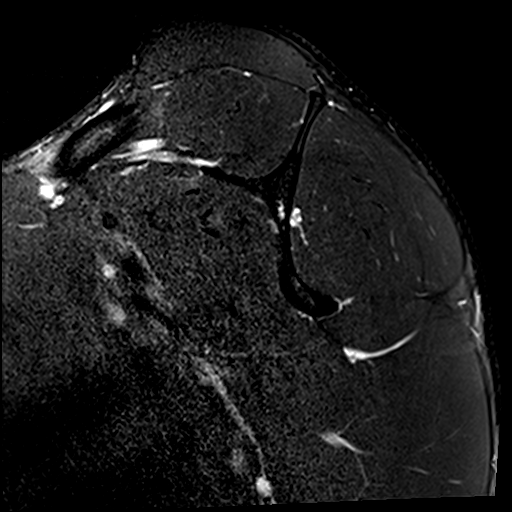
[im 6/28]
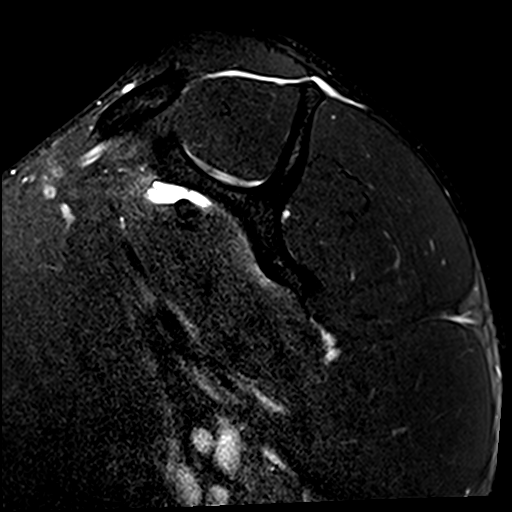
[im 14/28]
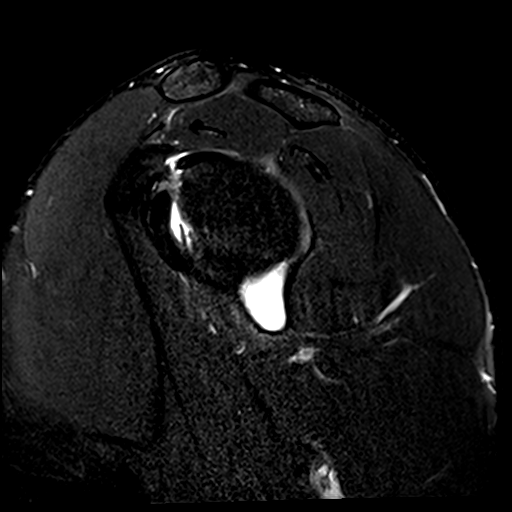
[im 25/28]
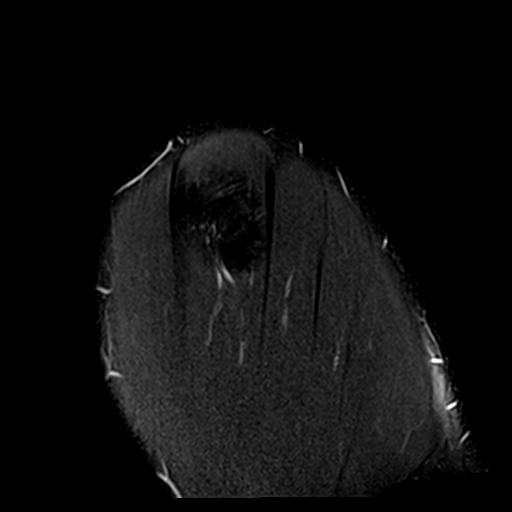

[34 of 40 positions shown; findings below may reference images not displayed]

FINDINGS: OSSEOUS: Impaction fracture of the posterolateral aspect of the humeral head measuring 22 mm, with moderate underlying bone marrow edema. Mild bone marrow edema is present within the acromion. A small age-indeterminate nondisplaced fracture of the anterior inferior glenoid is present (series 5, image 16).

ACROMIAL OUTLET: The acromioclavicular joint is unremarkable in appearance. The acromion is nonhooked. The coracoacromial clavicular and coracoacromial ligaments are intact. No significant fluid within the subacromial/subdeltoid bursa.

ROTATOR CUFF:

Supraspinatus and infraspinatus: Intact.

Teres minor: Intact.

Subscapularis: Intact.

Fatty atrophy: The rotator cuff muscles are maintained.

LABRUM: Evidence of prior labral repair with 2 suture anchors within the anteroinferior glenoid. Ovoid morphology of the anterior inferior glenoid, compatible with prior labral repair. Increased signal intensity extending near the chondral labral junction of the anterior inferior glenoid is favored to represent a nondisplaced fracture of the anterior inferior glenoid, as opposed to a tear through the chondrolabral junction.

BICEPS TENDON: The proximal intra-articular and extra-articular long head biceps tendon are intact.

GLENOHUMERAL JOINT: The glenohumeral joint is normal in alignment. Full-thickness chondrosis along the fracture of the anterior inferior glenoid. The glenohumeral articular cartilage is otherwise maintained.

OTHER: High-grade partial-thickness tearing/stripping of the inferior glenohumeral ligament, at the glenoid origin. Moderate glenohumeral joint effusion. 10 x 5 mm region of nodular intermediate signal interposed between the anterior inferior labrum and the humeral head, likely representing synovitis and/or scar.
IMPRESSION: 1.
Evidence of recent anterior shoulder dislocation, with impaction fracture of the posterolateral humeral head, and suspected small age-indeterminate fracture nondisplaced through the anterior inferior glenoid. Further evaluation with noncontrast CT of the left shoulder could be performed to further evaluate the glenoid fracture.

2.
Evidence of prior anterior inferior labral repair, with abnormal morphology of the anteroinferior glenoid, likely representing postsurgical change. Increased signal intensity near the chondral labral junction is favored to represent a fracture through the anteroinferior glenoid as opposed to a tear through the chondrolabral junction.

3.
High-grade partial-thickness tearing/stripping of the inferior glenohumeral ligament, at the glenoid origin.

STAT fax

## 2023-03-11 IMAGING — MR MRI ANKLE LT WO CONTRAST
4 of 6 series · 15 of 40 positions shown · non-contrast
Comparison: None available.

Images Obtained from [facility]
INDICATION: Pain in left ankle and joints of left foot, Strain of left Achilles tendon, initial encounter,
TECHNIQUE: Multiplanar, multisequence imaging of the left ankle/hindfoot was performed without contrast.

[Series 3: t2_sag_fs · sagittal · left · 3.0mm · 0.31mm/px · 4 of 22 slices shown]
[im 1/22]
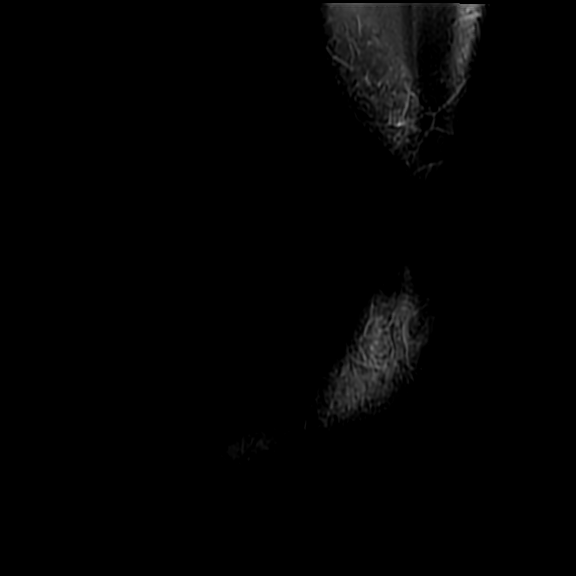
[im 8/22]
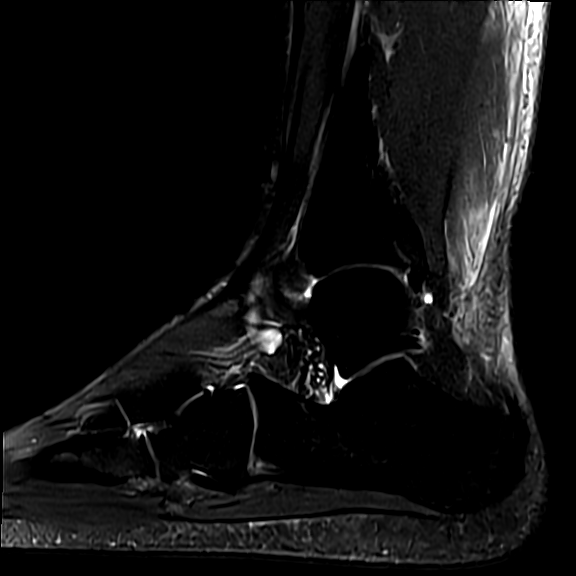
[im 15/22]
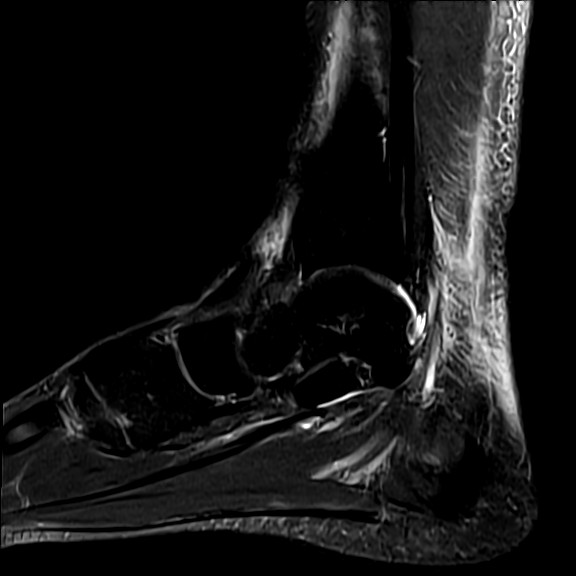
[im 22/22]
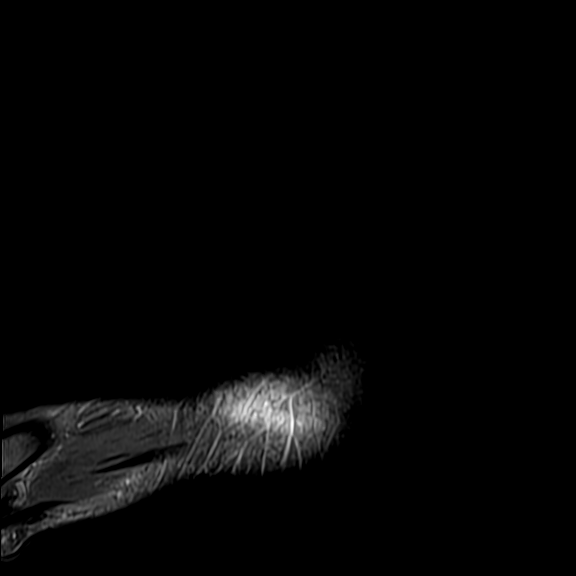

[Series 4: t1_sag · sagittal · left · 3.0mm · 0.30mm/px · 4 of 22 slices shown]
[im 1/22]
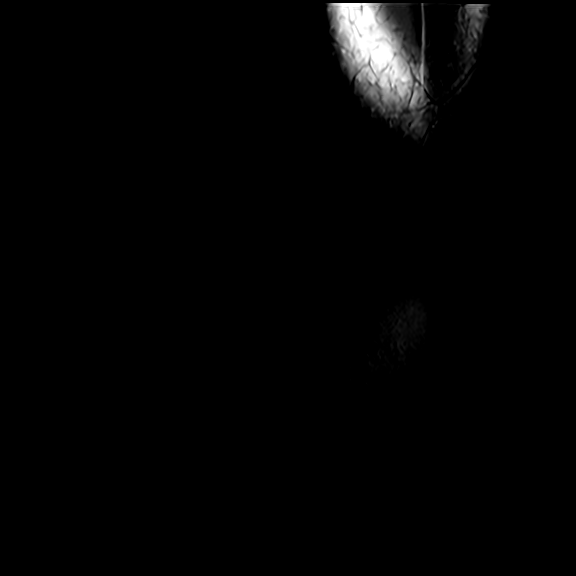
[im 8/22]
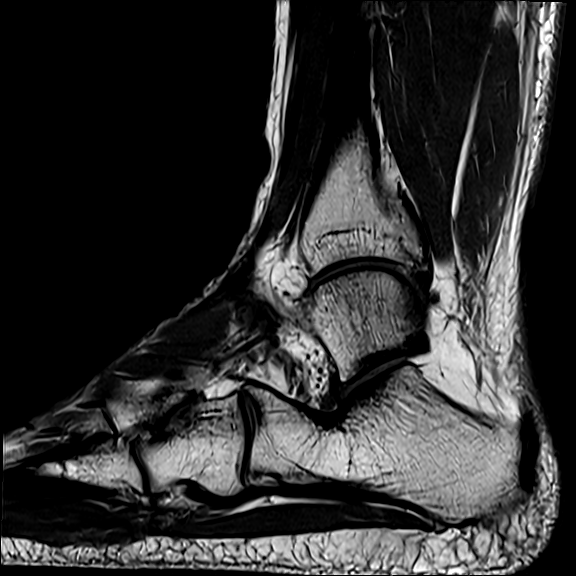
[im 15/22]
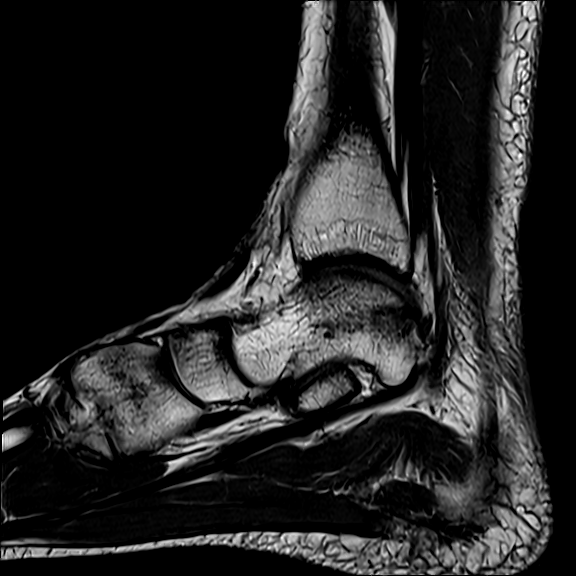
[im 22/22]
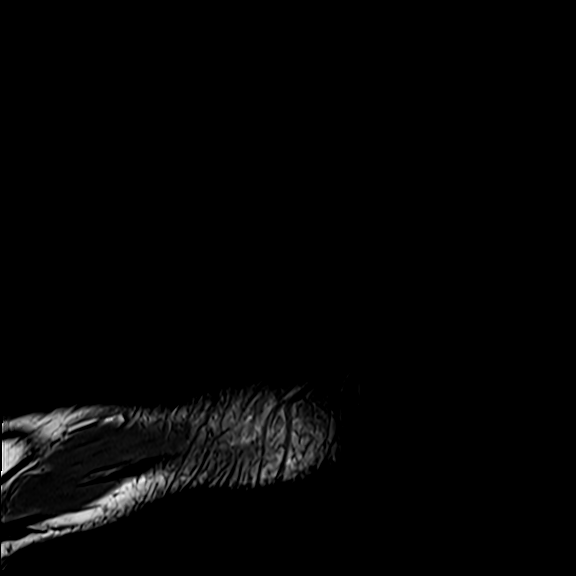

[Series 5: t2_cor_fs · coronal · left · 3.0mm · 0.30mm/px · 4 of 34 slices shown]
[im 1/34]
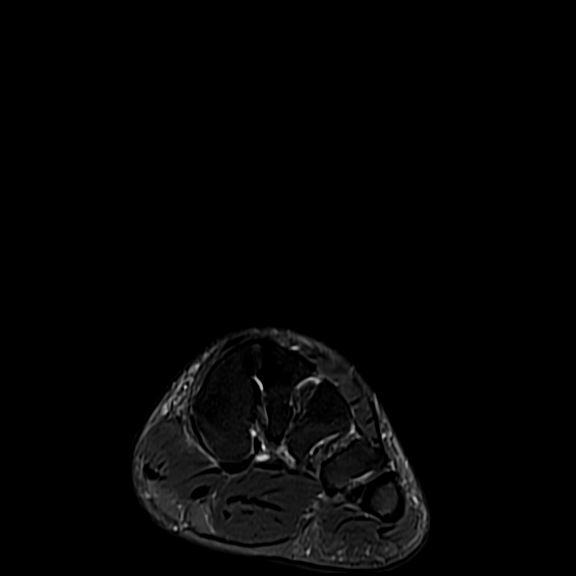
[im 6/34]
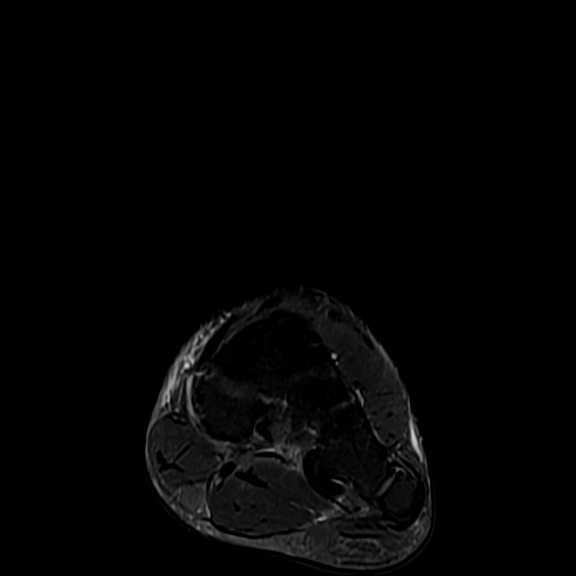
[im 17/34]
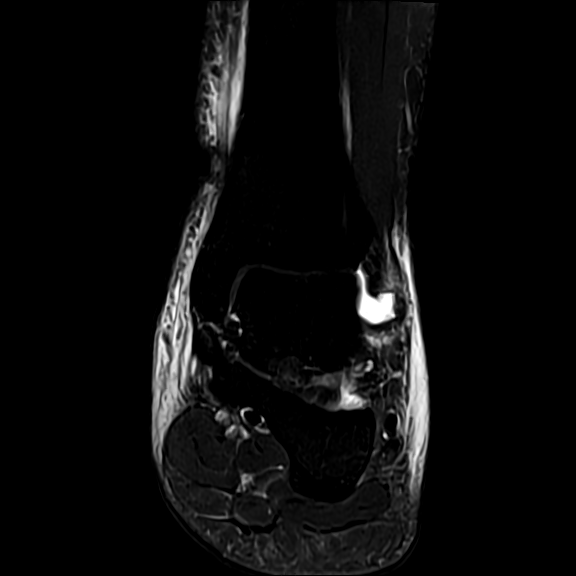
[im 28/34]
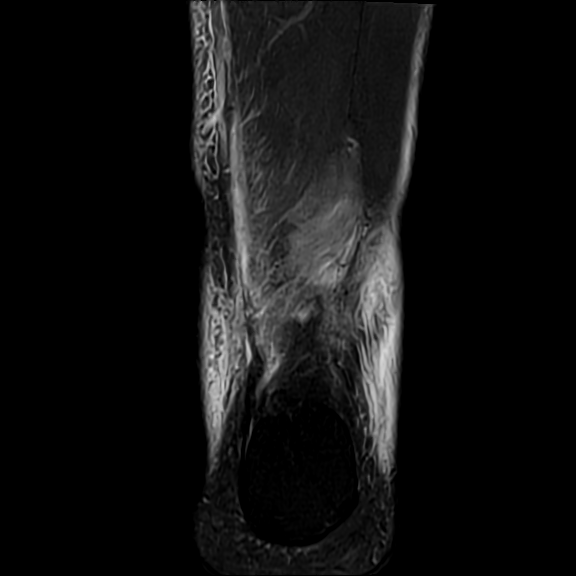

[Series 6: t1_axial · axial · left · 3.0mm · 0.28mm/px · z∈[-58,+62]mm · 3 of 42 slices shown]
[im 6/42]
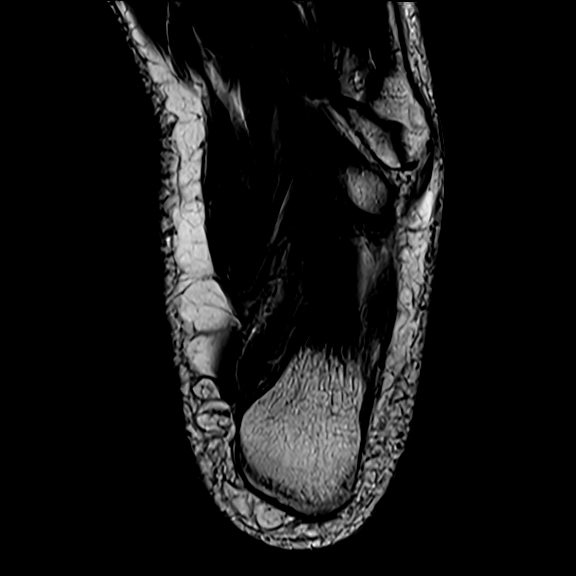
[im 21/42]
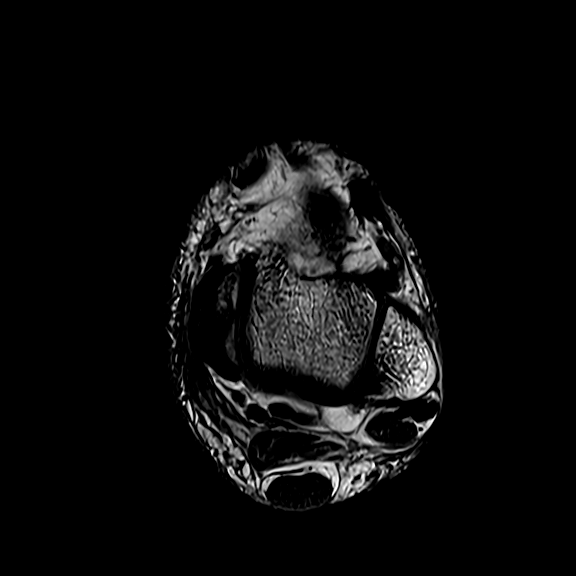
[im 36/42]
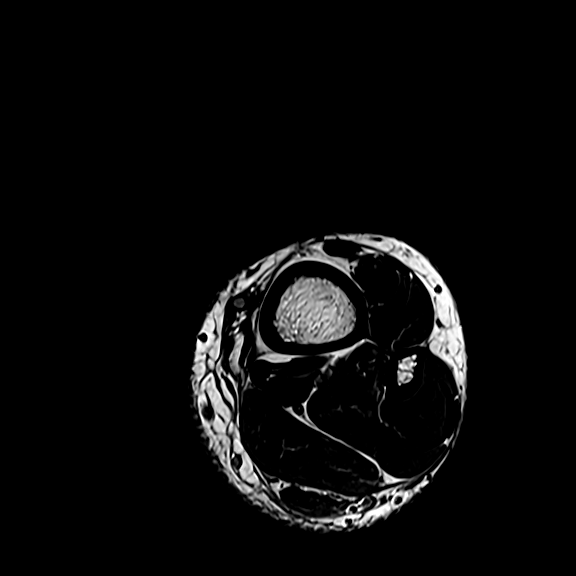

[15 of 40 positions shown; findings below may reference images not displayed]

FINDINGS: OSSEOUS: No acute fracture, avascular necrosis or aggressive osseous lesion.
TIBIOTALAR JOINT:
Articular cartilage: Intact. No talar dome osteochondral lesion.
Tibiotalar joint effusion and intra-articular bodies: None.
LIGAMENTS:
Anterior talofibular ligament: Intact.
Calcaneofibular ligament: Intact.
Posterior talofibular ligament: Intact.
Anterior and posterior syndesmotic ligaments: Intact.
Deep fibers the deltoid ligament: Intact.
Spring ligament: Intact.
TENDONS:
Peroneus brevis tendon: Intact.
Peroneus longus tendon: Intact.
Tibialis posterior tendon: Intact.
Flexor digitorum longus: Intact.
Flexor hallucis longus: Intact.
Anterior ankle tendons: Intact.
Achilles tendon: Complete tear of the Achilles tendon, approximately 7 cm from the insertion with a tendon gap of up to 2 cm. Moderate fluid/hemorrhage about the torn tendon fibers.
Other: Partial thickness tear along the distal myotendinous junction of an accessory soleus muscle, with moderate edema and laxity of the intramuscular portion of the tendon (series 3, image 9).
PLANTAR APONEUROSIS: Unremarkable.
OTHER:
Sinus tarsi: No scar or edema.
Tarsal tunnel: Intact.
IMPRESSION: 1.  Complete tear of the Achilles tendon, approximately 7 cm from the insertion with a tendon gap of up to 2 cm.
2.  Partial thickness tear along the distal myotendinous junction of an accessory soleus muscle, with moderate edema and laxity of the intramuscular portion of the tendon.

## 2024-04-13 ENCOUNTER — Encounter (HOSPITAL_COMMUNITY): Payer: Self-pay

## 2024-04-13 ENCOUNTER — Inpatient Hospital Stay (HOSPITAL_COMMUNITY)

## 2024-04-13 ENCOUNTER — Observation Stay: Admission: EM | Admit: 2024-04-13 | Discharge: 2024-04-13 | Discharge status: Against Medical Advice

## 2024-04-13 ENCOUNTER — Other Ambulatory Visit (HOSPITAL_COMMUNITY): Payer: Self-pay

## 2024-04-13 DIAGNOSIS — E876 Hypokalemia: Secondary | ICD-10-CM | POA: Insufficient documentation

## 2024-04-13 DIAGNOSIS — K760 Fatty (change of) liver, not elsewhere classified: Secondary | ICD-10-CM | POA: Insufficient documentation

## 2024-04-13 DIAGNOSIS — D72819 Decreased white blood cell count, unspecified: Secondary | ICD-10-CM | POA: Insufficient documentation

## 2024-04-13 DIAGNOSIS — E1165 Type 2 diabetes mellitus with hyperglycemia: Secondary | ICD-10-CM | POA: Insufficient documentation

## 2024-04-13 DIAGNOSIS — R160 Hepatomegaly, not elsewhere classified: Secondary | ICD-10-CM | POA: Insufficient documentation

## 2024-04-13 DIAGNOSIS — Z5329 Procedure and treatment not carried out because of patient's decision for other reasons: Secondary | ICD-10-CM | POA: Insufficient documentation

## 2024-04-13 DIAGNOSIS — F10939 Alcohol use, unspecified with withdrawal, unspecified (CMS-HCC): Principal | ICD-10-CM

## 2024-04-13 DIAGNOSIS — F1093 Alcohol use, unspecified with withdrawal, uncomplicated (CMS-HCC): Secondary | ICD-10-CM | POA: Diagnosis present

## 2024-04-13 DIAGNOSIS — F10230 Alcohol dependence with withdrawal, uncomplicated: Principal | ICD-10-CM | POA: Insufficient documentation

## 2024-04-13 LAB — CBC WITH DIFF, BLOOD
ANC-Automated: 1.7 1000/mm3 (ref 1.6–7.0)
Abs Basophils: 0 1000/mm3 (ref <0.2)
Abs Eosinophils: 0 1000/mm3 (ref 0.0–0.5)
Abs Lymphs: 1.3 1000/mm3 (ref 0.8–3.1)
Abs Monos: 0.4 1000/mm3 (ref 0.2–0.8)
Basophils: 0.9 %
Eosinophils: 0.3 %
Hct: 43.3 % (ref 40.0–50.0)
Hgb: 14.4 g/dL (ref 13.7–17.5)
Imm Gran %: 0.3 % (ref <1)
Imm Gran Abs: 0 1000/mm3 (ref <0.1)
Lymphocytes: 37.8 %
MCH: 27.6 pg (ref 26.0–32.0)
MCHC: 33.3 g/dL (ref 32.0–36.0)
MCV: 83 um3 (ref 79.0–95.0)
MPV: 10.4 fL (ref 9.4–12.4)
Monocytes: 11.4 %
Plt Count: 234 1000/mm3 (ref 140–370)
RBC: 5.22 mill/mm3 (ref 4.60–6.10)
RDW: 12.4 % (ref 12.0–14.0)
Segs: 49.3 %
WBC: 3.5 1000/mm3 — ABNORMAL LOW (ref 4.0–10.0)

## 2024-04-13 LAB — COMPREHENSIVE METABOLIC PANEL, BLOOD
ALT: 190 U/L — ABNORMAL HIGH (ref 0–41)
ANION: 17 mmol/L — ABNORMAL HIGH (ref 7–15)
AST: 81 U/L — ABNORMAL HIGH (ref 0–40)
Albumin: 4.2 g/dL (ref 3.5–5.2)
Alkaline Phosphatase: 89 U/L (ref 40–129)
BUN: 7 mg/dL (ref 6–20)
Bicarbonate: 27 mmol/L (ref 22–29)
Bilirubin Total: 1.1 mg/dL (ref <1.2)
Calcium: 10.2 mg/dL (ref 8.5–10.6)
Chloride: 95 mmol/L — ABNORMAL LOW (ref 98–107)
Creatinine: 0.79 mg/dL (ref 0.67–1.17)
Glucose: 118 mg/dL — ABNORMAL HIGH (ref 60–99)
Potassium: 3.4 mmol/L — ABNORMAL LOW (ref 3.5–5.1)
Sodium: 139 mmol/L (ref 136–145)
Total Protein: 8.3 g/dL — ABNORMAL HIGH (ref 6.0–8.0)
eGFR based on CKD-EPI 2021 equation: >60 mL/min/1.73 m2

## 2024-04-13 LAB — GLUCOSE POCT, BLOOD: Glucose (POCT): 227 — ABNORMAL HIGH (ref 70–100)

## 2024-04-13 LAB — ALCOHOL, BLOOD: Alcohol: <3 mg/dL

## 2024-04-13 LAB — GLYCOSYLATED HGB(A1C), BLOOD: Glyco Hgb (A1C): 7.4 % — ABNORMAL HIGH (ref 4.8–5.6)

## 2024-04-13 LAB — POTASSIUM, BLOOD: Potassium: 3.6 mmol/L (ref 3.5–5.1)

## 2024-04-13 MED ORDER — ALUM & MAG HYDROXIDE-SIMETH 200-200-20 MG/5ML OR SUSP
30.0000 mL | Freq: Four times a day (QID) | ORAL | Status: DC | PRN
Start: 2024-04-13 — End: 2024-04-13

## 2024-04-13 MED ORDER — FOLIC ACID 1 MG OR TABS
1.0000 mg | ORAL_TABLET | Freq: Every day | ORAL | Status: DC
Start: 2024-04-13 — End: 2024-04-13
  Administered 2024-04-13: 1 mg via ORAL
  Filled 2024-04-13: qty 1

## 2024-04-13 MED ORDER — SODIUM CHLORIDE 0.9 % IV SOLN
Freq: Once | INTRAVENOUS | Status: AC
Start: 2024-04-13 — End: 2024-04-13

## 2024-04-13 MED ORDER — ACETAMINOPHEN 650 MG RE SUPP
650.0000 mg | Freq: Four times a day (QID) | RECTAL | Status: DC | PRN
Start: 2024-04-13 — End: 2024-04-13

## 2024-04-13 MED ORDER — GLUCAGON HCL 1 MG IJ SOLR
1.0000 mg | Freq: Once | INTRAMUSCULAR | Status: DC | PRN
Start: 2024-04-13 — End: 2024-04-13

## 2024-04-13 MED ORDER — PHENOBARBITAL SODIUM 130 MG/ML IJ SOLN
260.0000 mg | Freq: Once | INTRAMUSCULAR | Status: AC
Start: 2024-04-13 — End: 2024-04-13
  Administered 2024-04-13: 260 mg via INTRAVENOUS
  Filled 2024-04-13: qty 2

## 2024-04-13 MED ORDER — ACETAMINOPHEN 160 MG/5ML OR SOLN
650.0000 mg | Freq: Four times a day (QID) | ORAL | Status: DC | PRN
Start: 2024-04-13 — End: 2024-04-13

## 2024-04-13 MED ORDER — DEXTROSE (DIABETIC USE) 40 % OR GEL
1.0000 | ORAL | Status: DC | PRN
Start: 2024-04-13 — End: 2024-04-13

## 2024-04-13 MED ORDER — THIAMINE HCL 100 MG OR TABS (CUSTOM)
100.0000 mg | ORAL_TABLET | Freq: Every day | ORAL | Status: DC
Start: 2024-04-14 — End: 2024-04-13

## 2024-04-13 MED ORDER — ACETAMINOPHEN 325 MG PO TABS
650.0000 mg | ORAL_TABLET | Freq: Three times a day (TID) | ORAL | Status: DC | PRN
Start: 2024-04-13 — End: 2024-04-13

## 2024-04-13 MED ORDER — ENOXAPARIN SODIUM 40 MG/0.4ML IJ SOSY
40.0000 mg | PREFILLED_SYRINGE | Freq: Every day | INTRAMUSCULAR | Status: DC
Start: 2024-04-13 — End: 2024-04-13
  Administered 2024-04-13: 40 mg via SUBCUTANEOUS
  Filled 2024-04-13: qty 1

## 2024-04-13 MED ORDER — DEXTROSE 50 % IV SOLN
12.5000 g | INTRAVENOUS | Status: DC | PRN
Start: 2024-04-13 — End: 2024-04-13

## 2024-04-13 MED ORDER — PHENOBARBITAL SODIUM 130 MG/ML IJ SOLN
130.0000 mg | Freq: Once | INTRAMUSCULAR | Status: AC
Start: 2024-04-13 — End: 2024-04-13
  Administered 2024-04-13: 130 mg via INTRAVENOUS
  Filled 2024-04-13: qty 1

## 2024-04-13 MED ORDER — ACETAMINOPHEN 325 MG PO TABS
650.0000 mg | ORAL_TABLET | Freq: Four times a day (QID) | ORAL | Status: DC | PRN
Start: 2024-04-13 — End: 2024-04-13

## 2024-04-13 MED ORDER — THIAMINE HCL 100 MG OR TABS (CUSTOM)
100.0000 mg | ORAL_TABLET | Freq: Once | ORAL | Status: AC
Start: 2024-04-13 — End: 2024-04-13
  Administered 2024-04-13: 100 mg via ORAL
  Filled 2024-04-13: qty 1

## 2024-04-13 MED ORDER — ONDANSETRON HCL 4 MG/2ML IV SOLN
4.0000 mg | Freq: Four times a day (QID) | INTRAMUSCULAR | Status: DC | PRN
Start: 2024-04-13 — End: 2024-04-13

## 2024-04-13 MED ORDER — ONDANSETRON HCL 4 MG/2ML IV SOLN
4.0000 mg | Freq: Once | INTRAMUSCULAR | Status: AC
Start: 2024-04-13 — End: 2024-04-13
  Administered 2024-04-13: 4 mg via INTRAVENOUS
  Filled 2024-04-13: qty 2

## 2024-04-13 MED ORDER — GLUCOSE 4 GM PO CHEW (CUSTOM)
4.0000 | CHEWABLE_TABLET | ORAL | Status: DC | PRN
Start: 2024-04-13 — End: 2024-04-13

## 2024-04-13 MED ORDER — INSULIN LISPRO (HUMAN) 100 UNIT/ML SC SOLN (CUSTOM)
0.0000 [IU] | Freq: Every evening | INTRAMUSCULAR | Status: DC
Start: 2024-04-13 — End: 2024-04-13

## 2024-04-13 MED ORDER — INSULIN LISPRO (HUMAN) 100 UNIT/ML SC SOLN (CUSTOM)
0.0000 [IU] | Freq: Three times a day (TID) | INTRAMUSCULAR | Status: DC
Start: 2024-04-13 — End: 2024-04-13

## 2024-04-13 MED ORDER — POLYETHYLENE GLYCOL 3350 OR PACK
17.0000 g | PACK | Freq: Every day | ORAL | Status: DC | PRN
Start: 2024-04-13 — End: 2024-04-13

## 2024-04-13 MED ORDER — MELATONIN 1 MG OR TABS
2.0000 mg | ORAL_TABLET | Freq: Every evening | ORAL | Status: DC | PRN
Start: 2024-04-13 — End: 2024-04-13

## 2024-04-13 MED ORDER — DIAZEPAM 5 MG OR TABS
10.0000 mg | ORAL_TABLET | ORAL | Status: DC | PRN
Start: 2024-04-13 — End: 2024-04-13

## 2024-04-13 MED ORDER — DIAZEPAM 5 MG/ML IJ SOLN
10.0000 mg | INTRAMUSCULAR | Status: DC | PRN
Start: 2024-04-13 — End: 2024-04-13
  Administered 2024-04-13: 10 mg via INTRAVENOUS
  Filled 2024-04-13: qty 2

## 2024-04-13 MED ORDER — NALOXONE HCL 0.4 MG/ML IJ SOLN
0.1000 mg | INTRAMUSCULAR | Status: DC | PRN
Start: 2024-04-13 — End: 2024-04-13

## 2024-04-13 MED ORDER — POTASSIUM CHLORIDE CRYS CR 20 MEQ OR TBCR
40.0000 meq | EXTENDED_RELEASE_TABLET | Freq: Once | ORAL | Status: AC
Start: 2024-04-13 — End: 2024-04-13
  Administered 2024-04-13: 40 meq via ORAL
  Filled 2024-04-13: qty 2

## 2024-04-13 NOTE — ED Triage Notes (Signed)
 [facility] - Nausea and Vomiting + etoh withdrawal

## 2024-04-13 NOTE — ED Notes (Signed)
Attempted to give report. RN will call me back.

## 2024-04-13 NOTE — ED Notes (Signed)
Bed side U/S is being done at this time.

## 2024-04-13 NOTE — Plan of Care (Signed)
 Problem: Promotion of Health and Safety  Goal: Promotion of Health and Safety  Description: The patient remains safe, receives appropriate treatment and achieves optimal outcomes (physically, psychosocially, and spiritually) within the limitations of the disease process by discharge.    Information below is the current care plan.  Outcome: Progressing  Flowsheets (Taken 04/13/2024 1433)  Patient /Family stated Goal: To feel better  Individualized Interventions/Recommendations #1: monitor and treat alcohol  withdrawl symptoms  Individualized Interventions/Recommendations #2 (if applicable): safety/ fall precautions  Individualized Interventions/Recommendations #3 (if applicable): monitor electrolyte imbalances.  Individualized Interventions/Recommendations #4 (if applicable): monitor for arrythmias  Individualized Interventions/Recommendations #5 (if applicable): monitor and treat blood sugars  Outcome Evaluation (rationale for progressing/not progressing) every shift: patient axox4, vitals stable, ciwa 15, treated, potassium supplementation done, call light in reach, bed alarm on, will contioue to monitor for changes  Note:

## 2024-04-13 NOTE — ED OBS HPI (Signed)
 ED OBS HPI/Admission Note    Please see provider note for details. Pt informed they are in ED Observation Status.    Reason for ED Observation: etoh abuse and withdrawal    Plan while in ED Observation: social work eval ciwa

## 2024-04-13 NOTE — H&P (Signed)
 [facility]  [facility] HOSPITALIST HISTORY & PHYSICAL    Chief Complaint:  Vomiting  [facility] - Nausea and Vomiting + etoh withdrawal - Zofran  4mg  ODT    HISTORY OF PRESENT ILLNESS (HPI)  Christopher [name] is a 33 year old male who presented on 04/13/2024 at 5:27 AM with nausea, vomiting, tremor, anxiety, diaphoresis, and insomnia. He reports a 5-year history of heavy alcohol  use, with a single 74-month period of abstinence. He stopped drinking recently and subsequently developed the above symptoms, prompting him to seek evaluation. He denies any prior history of alcohol  withdrawal seizures. On arrival, he was noted to have moderate withdrawal symptoms (CIWA-Ar scores ranging from 4 to 17 in the first hours). He received Zofran  4mg  ODT for nausea. No prior medical or surgical history is documented. No family history is available. No known allergies. No home medications. Social history is notable for heavy alcohol  use; tobacco and drug use are not documented. No outside hospital notes or prior discharge summaries available.    PAST MEDICAL HISTORY  No past medical history on file.    PAST SURGICAL HISTORY  No past surgical history on file.    REVIEW OF SYSTEMS  Positive for nausea, vomiting, tremor, anxiety, diaphoresis, and insomnia.  All other systems reviewed and are negative except for what is mentioned above.    SOCIAL HISTORY  He has no history on file for tobacco use.  He has no history on file for alcohol  use.  He has no history on file for drug use.    FAMILY HISTORY  No family history on file.    ALLERGIES  No Known Allergies    HOME MEDICATIONS  None    ACTIVE MEDICATIONS  No active medications documented.    OBJECTIVE  Vital Signs:  Temperature: 98.2 F (36.8 C) (09/09 0805)  Blood pressure: 149/74 (09/09 1057)  Heart Rate: 66 (09/09 1057)  Respirations: 16 (09/09 1057)  Pain Score: 6 (09/09 1057)  O2 Device: None (Room air) (09/09 1057)  SpO2: 97% (09/09 1057)  CIWA-Ar: 17  (earliest), 7 (latest)  PHYSICAL EXAM:  Gen: Not in acute distress.  HEENT: Neck supple, no carotid bruit or lymphadenopathy. Pupils equal/reactive. No oral lesions.  Resp: Lungs clear bilaterally.  Cardio: Regular rate/rhythm, normal S1/S2, no murmur.  Abd: Soft, non-tender, non-distended, normal bowel sounds.  Ext: No peripheral edema.  Neuro: Moves all extremities, no deficits.  Psych: Pleasant, appropriate, well-kempt.    LABS & DIAGNOSTICS:  CBC: WBC 14.4, Hgb/Hct not provided, Plt 234  BMP: Na 139, K 3.4*, Cl 95*, CO2 27, BUN 7, Cr 0.79, Glu 118*  Ca: 10.2  Diff: %S 49.3, %L 37.8, %M 11.4, %E 0.3  No microbiology results.  No imaging studies available.    ASSESSMENT & PLAN  # Alcohol  withdrawal syndrome, uncomplicated (F10.930) [present on admission]  Clinical Reasoning:  The patient presents with classic symptoms of alcohol  withdrawal (nausea, vomiting, tremor, anxiety, diaphoresis, insomnia) after abrupt cessation of heavy alcohol  use. CIWA-Ar scores indicate mild to moderate withdrawal. No history of withdrawal seizures or delirium tremens. No evidence of severe withdrawal or complications at this time.  Treatment Plan:  Continue CIWA-Ar protocol with symptom-triggered benzodiazepines as indicated  Monitor for progression to severe withdrawal (seizures, delirium tremens)  Thiamine , folate, and multivitamin supplementation  IV fluids for volume repletion as needed  Monitor electrolytes and correct abnormalities  Supportive care for nausea (Zofran  PRN)  Monitor for complications (Wernicke's, electrolyte derangements)  Social  work consult for alcohol  dependence counseling and disposition planning  Check limited abdominal ultrasound for transaminitis    # Hypokalemia (E87.6) [present on admission]  Clinical Reasoning:  Potassium 3.4 (mild hypokalemia) on admission, likely secondary to vomiting and poor oral intake in the setting of alcohol  withdrawal.  Treatment Plan:  Oral or IV potassium supplementation to  maintain K >4.0  Monitor serial electrolytes  Address ongoing losses (vomiting, poor intake)  Replete magnesium if low    # Chronic Problems [present on admission]  None documented.    DVT Prophylaxis: Mechanical or pharmacologic prophylaxis per protocol  Code Status: No order  Disposition: Anticipate discharge to home after completion of withdrawal protocol and stabilization  GMLOS: 3  Expected Date of Discharge: 04/16/2024  ##75## minutes required to complete the initial MDM above.

## 2024-04-13 NOTE — ED Notes (Signed)
 Assumed pt care, pt placed on cardiac monitor

## 2024-04-13 NOTE — ED Notes (Signed)
 Scanned patient ambulance report

## 2024-04-13 NOTE — Interdisciplinary (Signed)
 RN went in room to check and patient was gone.  RN inspected the room and patient removed his IV. Patient still wearing telemetry monitor.    This RN called patient twice and left a message for patient to call back.

## 2024-04-13 NOTE — ED Notes (Signed)
 Assumed care, pt is a/o x 4, pending dispo.

## 2024-04-13 NOTE — ED Triage Notes (Signed)
Zofran 4mg  ODT

## 2024-04-13 NOTE — Interdisciplinary (Signed)
 Spiritual Care Note    Date of Spiritual Care Visit: 04/13/24  Referred By: MD/DO/NP/PA    Spiritual Screening Provided: Yes  Date of Spiritual Screening: 04/13/24 Christopher [name])    Assessment: Granger reports feeling at peace and refused spiritual care services at this time. He stated that he has a strong daily prayer practice and agreed to request other spiritual care services as needed.     Interventions: Discuss spiritual resources/coping, [facility], Prayer/Blessing    Outcomes: Gratitude, Declines spiritual care at this time    Will Spiritual Care provide a Follow-Up Visit? No    [place], Chaplain  04/13/2024 12:17 PM    To request Spiritual Care services, please place an order for IP Consult to Spiritual Care.  For urgent/STAT requests, please also page [phone].

## 2024-04-13 NOTE — Interdisciplinary (Signed)
 Patient last seen at 1630 in his room. Pateint alert sent to rn because he was off telemetry, checked in on the patient ar 1650. Patient not in room or on floor. Telemetry box left behind with iv out. Called security attempted to find patient to no avail.Md aware. We will keep patients admission open until 1830 if he returns.

## 2024-04-13 NOTE — Interdisciplinary (Signed)
 [facility]  ED SOCIAL WORK CONSULT NOTE:      REASON FOR CONSULT: SUD Screening  LEGAL STATUS: VOL  LOCATION: ED Bed 09  PROVIDER: Emergency Medicine  ------------------------------------------------     04/13/24 0810   Assessment   Assessment Type Initial;Face to Face   Referral Information   Consult Type Substance Use Disorder   Social Assessment   Where was the patient admitted from? * Home   Mode of Arrival Ambulance   Prior to Level of Function * Ambulatory   Primary Caretaker(s) * Self   Is Patient Minor? No   Interpreter Used? Not Needed   Literacy Can read;Can write   Living Arrangements on Admission* Family Member   A List of Community Resources and/or Shelters Provided Yes   Available Assistance/Support System * Family member(s)   Do You Have Transportation Issues/Concerns That Make It Difficult To Get To Your Appointments? * Yes  (Pt reported difficulty d/t lack of funds. Pt provided [facility] [employer])   Patient Engaged in Discharge Planning * Yes   PATIENT CHOICE: Patient/Family/Legal/Surrogate Decision Maker Has Been Given a List Options And Choice In The Selection of Post-Acute Care Providers Yes   Family/Caregiver's Assessed for * Not Applicable   Respite Care * Not Applicable   Patient/Family/Other Are In Agreement With Discharge Plan * Yes   Primary Care Access Other (Comment)  (provided resources)   Income Information   Income Source Retail buyer History Not Applicable   Veterans Affiliation No   Audit-C Questionnaire    Do you drink alcohol ?  yes   How often do you have a drink containing alcohol ? 4   How many standard drinks containing alcohol  do you have on a typical day? 4   How often do you have six or more drinks on one occasion? 4   Audit-C Score 12   Plans/Interventions/Discharge   Plan/Interventions Provide education & resources for substance/alcohol  abuse;Resources given   Anticipated Discharge Destination Home     HISTORY OF PRESENT ILLNESS: Pt is  a 33 year old male with PMH alcohol  dependence, diabetes type 2, and hypertension who presents to the ED via BLS with alcohol  withdrawals.     INTERVENTION: SW met with patient bedside for assessment. SW introduced self and the nature of the visit; patient agreed to participate. Patient presented alert, awake, willing to engage, cooperative, and oriented x4. Patient denied active suicidal ideation, homicidal ideation, auditory hallucinations and visual hallucinations. Patient denied history of suicide attempts and reported no intention with attempting to overdose with alcohol  prior to ED visit. Patient reported wanting assistance with substance use and was interested in residential services. Patient denied being connected to any SUD services, therapy, or psychiatry at this time. SW reviewed resources with patient including inpatient and outpatient SUD resources, dual diagnosis programs, detox programs, and walk-in clinics.     Substance Use History:  ETOH- 4 times/week, current withdrawals. History of 4 months sobriety. No history of attending SUD related programs.     Social/Family History:  Marital: Single, no children  Income: Unemployed, [employer]  Housing: Lives with family  Education: Some Comptroller: No history  Religion: Christian       RESOURCES:   -Glass blower/designer   -Directory of Substance Use Disorder Programs for adults & older adults    DISCHARGE PLAN: Patient reported plan to discharge home with family with resources.   --------------------------------------------------------------------------------------------------  CONTACT:  Cena Sar, LCSW  ED Social Work - [facility] [facility]  [phone]

## 2024-04-13 NOTE — ED Provider Notes (Signed)
 This note was completed using approved AI per [place] protocols. ????????????????????????????  [facility]  ED PROVIDER NOTE  ????????????????????????????  Date of Service: 04/13/2024  Attending Provider: Arby Euler, DO  ????????????????????????????  Chief Complaint  Vomiting, alcohol  withdrawal ????????????????????????????  History of Present Illness  33 year old male with history of atrial fibrillation, diabetes, and hypertension presents with symptoms of alcohol  withdrawal. Reports heavy alcohol  use for 5 years, with a 44-month period of abstinence. Stopped drinking recently and is now experiencing nausea, vomiting, tremor, anxiety, and sweats. Unable to sleep due to symptoms. Denies history of alcohol  withdrawal seizures. No known past medical or surgical history on file. No known allergies. ????????????????????????????  Review of Systems  All others reviewed and noncontributory   ????????????????????????????  PFSH  Medical: Atrial fibrillation, diabetes, hypertension  Surgical: No past surgical history on file  Family: No family history on file  Social: Single, tobacco use unknown, no social drivers of health concerns (screened negative for IPV)   ????????????????????????????  Allergies  No known allergies. ????????????????????????????  Home Medications  No discharge medications on file. ????????????????????????????  Physical Exam  General: NAD, laying in bed, non-toxic appearing  HEENT: No scleral icterus, normocephalic/atraumatic  CV: S1S2, no murmurs appreciated  Respiratory: CTAB, no crackles/wheezing, normal work of breathing, not in respiratory distress  Abdomen: Soft, NTND  Neuro: CN II-XII grossly intact, moving all extremities spontaneously, AAOx3, tremor with outstretched hand  Skin: No rash, no pallor  Extremities: No LE edema, no obvious deformity  Psych: Anxious affect, linear thought process ????????????????????????????  Vitals and Triage Data  Initial Vitals: T 97.54F, BP 141/87, HR 51,  RR 17, SpO2 99%, BMI 35.7  Repeat/Trend Vitals: BP ranged up to 145/108, HR up to 85, stable otherwise  Oxygen Use: None required     ???????????????????????????  Labs and Diagnostics  Labs:  CMP: Glucose 118, Potassium 3.4, Chloride 95, Anion gap 17, Total protein 8.3, AST 81, ALT 190  CBC: WBC 3.5  Glyco Hgb (A1C): 7.4  Glucose POCT: 227      Per my interpretation, labs are significant for: Hypokalemia, mild transaminitis, mild hyperglycemia, mild leukopenia.  Imaging:  US  Abdomen Limited: Hepatomegaly with hepatic steatosis, no biliary ductal dilatation.  Per my interpretation, imaging suggests: Fatty liver disease, no acute biliary pathology.  ????????????????????????????  Clinical Calculators & Risk Scores  CIWA: Elevated, ongoing symptoms requiring pharmacologic management. ????????????????????????????    Assessment & Plan  Problems:  Alcohol  withdrawal syndrome with complication  Hypokalemia  Alcohol  withdrawal syndrome with complication  Clinical evaluation: Presents with classic symptoms of withdrawal (nausea, vomiting, tremor, anxiety, sweats), elevated CIWA, no history of withdrawal seizures. Labs notable for mild transaminitis and hyperglycemia. US  with hepatomegaly and steatosis.  Management: Treated with IV phenobarbital  (total 390 mg), IV fluids, thiamine , and ondansetron . Ongoing monitoring for progression of withdrawal, seizure precautions, and CIWA protocol. Admitted for continued management.  Hypokalemia  Clinical evaluation: Potassium 3.4, likely secondary to vomiting and poor intake.  Management: Repleted with 40 mEq oral potassium chloride . Monitor potassium levels and replace as needed.  If critical care provided, add below:  CRITICAL CARE TIME:    35 minutes    Treatments/Evaluations: Close monitoring and treatment of unstable vital signs, cardiorespiratory, and neurologic status, while maintaining tight balance of fluid, respiratory, and cardiac interventions. This time includes discussing  the case with the patient and the patient's family. This time does not include all procedures stated elsewhere in this record. This time also includes reviewing old records, labs  and radiological studies. This time includes examining and re-examining the patient. Additionally, this time also includes arranging care with admitting and consulting physicians. ????????????????????????????  ED Course  Data reviewed (labs, imaging, EKG, old records): CMP, CBC, A1C, POCT glucose, US  abdomen, MRSA/Candida auris screening, nursing notes, provider documentation.  Treatments and interventions: Phenobarbital  260 mg IV, phenobarbital  130 mg IV, sodium chloride  1L IV, ondansetron  4 mg IV, potassium chloride  40 mEq PO, thiamine  100 mg PO.  Consultant discussions (name/specialty): None at this time.  Serial re-exams: Ongoing monitoring, persistent tremor and nausea, elevated CIWA, additional phenobarbital  administered.  Changes in clinical status or key turning points: Persistent withdrawal symptoms requiring escalation of therapy.  Medications given: Phenobarbital , ondansetron , potassium chloride , thiamine , IV fluids.  Response to treatment: Symptoms improved but ongoing withdrawal, admitted for further management.  Disposition: Admission discussed with patient.   ????????????????????????  Disposition  Admission  Patient understanding, written and verbal instructions provided. ????????????????????????????  E/M Coding and Supporting Documentation  Number and complexity of problems addressed: Alcohol  withdrawal with complication, hypokalemia, multiple comorbidities.  Amount and/or complexity of data to be reviewed and analyzed: Multiple labs, imaging, serial assessments, CIWA scoring.  Risk of complication and/or morbidity or mortality of patient management: High, given risk of severe withdrawal, seizures, and electrolyte abnormalities.  Procedures: None performed at this time.  Attestation: All components reviewed and verified.  ????????????????????????????             Kary Arby Jansky, [place]  04/29/24 [phone]

## 2024-04-13 NOTE — Progress Notes (Signed)
 Encounter opened to query outside prescription records.     Ezequiel Kayser, PharmD, BCPS  Staff II Pharmacist

## 2024-04-13 NOTE — Discharge Summary (Signed)
 [facility]  [facility] DISCHARGE SUMMARY  Attending:  Roetta Spears Derryl Cons, MD  Chief Complaint:  Vomiting  [facility] - Nausea and Vomiting + etoh withdrawal - Zofran  4mg  ODT    Date of Admission:  04/13/2024  Date of Discharge:  04/13/2024 17:13  Patient Name:  Christopher [name]  MRN:  [id number]  DOB:  [birth year]    Principal Diagnosis (required)  Alcohol  withdrawal syndrome, uncomplicated (CMS-HCC) [present on admission]  Hospital Problem List (required)  Alcohol  withdrawal syndrome, uncomplicated (CMS-HCC) [present on admission]  Hypokalemia [present on admission]    Procedures Performed During This Hospitalization (required)  US  Abdomen Limited (04/13/2024): Hepatomegaly with hepatic steatosis. No biliary ductal dilatation. Performed by Radiology.  Diagnostics Performed During This Admission  CBC: WBC 14.4, Plt 234, mild leukocytosis.  BMP: Na 139, K 3.4 (mild hypokalemia), Cr 0.79, Glu 118.  Limited abdominal ultrasound: Hepatomegaly with hepatic steatosis, no biliary ductal dilatation.    Consultations Obtained During This Hospitalization  Social Work: Alcohol  dependence counseling and disposition planning.    Reason for Admission to the Hospital / History of Present Illness  Christopher [name] is a 33 year old male with a 5-year history of heavy alcohol  use who presented with nausea, vomiting, tremor, anxiety, diaphoresis, and insomnia after abrupt cessation of alcohol . He reported no prior history of withdrawal seizures or delirium tremens. On arrival, he was noted to have moderate withdrawal symptoms (CIWA-Ar scores 4-17). He received Zofran  for nausea. No prior medical or surgical history is documented. No known allergies or home medications. Social history is notable for heavy alcohol  use.    Hospital Course  The patient was admitted on 04/13/2024 for management of alcohol  withdrawal syndrome. On presentation, he exhibited symptoms of mild to moderate  withdrawal, with CIWA-Ar scores ranging from 4 to 17. He was managed with a symptom-triggered CIWA-Ar protocol, including close monitoring and as-needed benzodiazepines (none required during this brief stay). Supportive care included IV fluids for volume repletion, thiamine , folate, and multivitamin supplementation, and Zofran  for nausea. Electrolyte monitoring revealed mild hypokalemia (K 3.4), which was repleted with oral potassium. Serial labs were monitored and remained stable.  A limited abdominal ultrasound was performed due to concern for transaminitis, revealing hepatomegaly with hepatic steatosis but no biliary ductal dilatation. Social work was consulted for alcohol  dependence counseling and disposition planning. The patient's withdrawal symptoms improved rapidly, with CIWA-Ar scores decreasing to 6 at the time of discharge. He remained hemodynamically stable throughout his stay and did not require escalation of care.  Provider was paged at 5pm about RN unable to find patient in his room, security were informed, patient eloped and removed his IV.     Problem-Based Discharge Summary  Alcohol  withdrawal syndrome, uncomplicated [present on admission]:  Managed with CIWA-Ar protocol, supportive care, and vitamin supplementation. No complications. Symptoms improved, and patient was discharged in stable condition.  Hypokalemia [present on admission]:  Mild hypokalemia on admission, likely secondary to vomiting and poor intake. Treated with oral potassium supplementation and electrolyte monitoring. Resolved at discharge.    Tests Outstanding at Discharge Requiring Follow Up  None.    Discharge Condition (required)  Patient is alert, oriented, and in no acute distress. Vitals stable: Temp 97.34F, BP 141/87, HR 51, RR 17, SpO2 99%. CIWA-Ar score 6 at last assessment. Ambulating independently. Tolerating oral intake.    PHYSICAL EXAM  Gen: Not in acute distress.  Resp: Lungs clear to auscultation  bilaterally.  Cardio: Regular rate  and rhythm, normal S1/S2, no murmur.  Abd: Soft, non-tender, non-distended, normal bowel sounds.  Ext: No peripheral edema.    Discharge Diet  Diet Therapeutic; Cardiac (Low Fat/Low Sodium); No Caffeine    Discharge Medications  No new medications prescribed at discharge.    Where to Get Your Medications  No discharge medications prescribed.    Allergies  No Known Allergies    Discharge Disposition  Eloped     Discharge Code Status  Full Code    Follow Up Appointments  No appointments scheduled at discharge. Patient instructed to follow up with primary care provider and addiction counseling as arranged by social work. For appointments requested for after discharge that have not yet been scheduled, refer to the Post Discharge Referrals section of the After Visit Summary.    Discharging Physician  Roetta Spears Derryl Cons, MD  =============================================================  MIPS Discharge Checklist v1.1  IA. Heart Failure with reduced EF < 40% (HFrEF) now or in the past year?  NO, the EF is NOT <40%  IB. Heart Failure with reduced EF < 40% (HFrEF) now or in the past year?  NO, the EF is NOT <40%  II. Acute/subacute ischemic stroke or TIA history?  NO h/o ischemic stroke/TIA  [name]. Advance Care Planning (ACP) > 76 years old?  NO - Patient <47 years old  IV. Proton Pump Inhibitor (PPI) evaluation > 44 years old  NO- not applicable, no PPI  V. Vancomycin with MRSA Cellulitis  NO Cellulitis  VI. C. Diff HIGH risk  NO noted risk/concern for c.diff this encounter  VII. Medication reconciliation  Completed [phone]: The provider team has reviewed a list of current prescription and OTC medications including names, dosages and route of admission using all immediate resources available on the date of the encounter to perform a medication reconciliation.  VIII. Follow up Care  YES NOT MET (GA 561)- Patient discharged with counseling to follow-up with provider(s) documented in  discharge summary

## 2024-04-13 NOTE — Interdisciplinary (Signed)
 PT Contact       Row Name 04/13/24 1414       Therapy Contact Note    Therapy not provided at this time as Patient at functional baseline and has no skilled need for therapy.    Physical Therapy Screening Assessment Upon patient interview, patient notes functional status is at baseline level and has no new deficits. Physical Therapy evaluation and treatment not recommended.

## 2024-04-13 NOTE — ED Notes (Signed)
 Breakfast tray served and pt ate 50% of his food.

## 2024-04-13 NOTE — ED Notes (Signed)
 SW Miranda at bedside evaluating pt.

## 2024-04-14 ENCOUNTER — Emergency Department
Admission: EM | Admit: 2024-04-14 | Discharge: 2024-04-14 | Disposition: A | Discharge status: Home / Self Care | Attending: Emergency Medicine | Admitting: Emergency Medicine

## 2024-04-14 DIAGNOSIS — Z5900 Homelessness unspecified: Secondary | ICD-10-CM | POA: Insufficient documentation

## 2024-04-14 DIAGNOSIS — E876 Hypokalemia: Secondary | ICD-10-CM | POA: Insufficient documentation

## 2024-04-14 DIAGNOSIS — Y909 Presence of alcohol in blood, level not specified: Secondary | ICD-10-CM | POA: Insufficient documentation

## 2024-04-14 DIAGNOSIS — E860 Dehydration: Secondary | ICD-10-CM | POA: Insufficient documentation

## 2024-04-14 DIAGNOSIS — F10230 Alcohol dependence with withdrawal, uncomplicated: Secondary | ICD-10-CM | POA: Insufficient documentation

## 2024-04-14 DIAGNOSIS — F1093 Alcohol use, unspecified with withdrawal, uncomplicated (CMS-HCC): Secondary | ICD-10-CM

## 2024-04-14 LAB — CBC WITH DIFF, BLOOD
ANC-Automated: 3.3 1000/mm3 (ref 1.6–7.0)
Abs Basophils: 0 1000/mm3 (ref <0.2)
Abs Eosinophils: 0 1000/mm3 (ref 0.0–0.5)
Abs Lymphs: 1.3 1000/mm3 (ref 0.8–3.1)
Abs Monos: 0.5 1000/mm3 (ref 0.2–0.8)
Basophils: 0.6 %
Eosinophils: 0 %
Hct: 41 % (ref 40.0–50.0)
Hgb: 13.7 g/dL (ref 13.7–17.5)
Imm Gran %: 0.2 % (ref <1)
Imm Gran Abs: 0 1000/mm3 (ref <0.1)
Lymphocytes: 25 %
MCH: 27.2 pg (ref 26.0–32.0)
MCHC: 33.4 g/dL (ref 32.0–36.0)
MCV: 81.5 um3 (ref 79.0–95.0)
MPV: 10.8 fL (ref 9.4–12.4)
Monocytes: 9.1 %
Plt Count: 218 1000/mm3 (ref 140–370)
RBC: 5.03 mill/mm3 (ref 4.60–6.10)
RDW: 12.4 % (ref 12.0–14.0)
Segs: 65.1 %
WBC: 5 1000/mm3 (ref 4.0–10.0)

## 2024-04-14 LAB — CANDIDA AURIS NUCLEIC ACID DETECTION TEST: Candida auris Nucleic Acid Detection Test: NOT DETECTED

## 2024-04-14 LAB — PROTHROMBIN TIME, BLOOD
INR: 1.2 — ABNORMAL HIGH (ref 0.9–1.1)
Prothrombin Time: 12.4 s — ABNORMAL HIGH (ref 9.5–11.0)

## 2024-04-14 LAB — COMPREHENSIVE METABOLIC PANEL, BLOOD
ALT: 168 U/L — ABNORMAL HIGH (ref 0–41)
ANION: 11 mmol/L (ref 7–15)
AST: 88 U/L — ABNORMAL HIGH (ref 0–40)
Albumin: 4.1 g/dL (ref 3.5–5.2)
Alkaline Phosphatase: 87 U/L (ref 40–129)
BUN: 9 mg/dL (ref 6–20)
Bicarbonate: 30 mmol/L — ABNORMAL HIGH (ref 22–29)
Bilirubin Total: 0.9 mg/dL (ref <1.2)
Calcium: 9.9 mg/dL (ref 8.5–10.6)
Chloride: 93 mmol/L — ABNORMAL LOW (ref 98–107)
Creatinine: 1.16 mg/dL (ref 0.67–1.17)
Glucose: 203 mg/dL — ABNORMAL HIGH (ref 60–99)
Potassium: 3.4 mmol/L — ABNORMAL LOW (ref 3.5–5.1)
Sodium: 134 mmol/L — ABNORMAL LOW (ref 136–145)
Total Protein: 7.9 g/dL (ref 6.0–8.0)
eGFR based on CKD-EPI 2021 equation: >60 mL/min/1.73 m2

## 2024-04-14 MED ORDER — PHENOBARBITAL SODIUM 130 MG/ML IJ SOLN
260.0000 mg | Freq: Once | INTRAMUSCULAR | Status: AC
Start: 2024-04-14 — End: 2024-04-14
  Administered 2024-04-14: 260 mg via INTRAVENOUS
  Filled 2024-04-14: qty 2

## 2024-04-15 LAB — SYPHILIS EIA SCREEN, BLOOD: Syphilis Screen: NEGATIVE

## 2024-04-15 LAB — MRSA SURVEILLANCE CULTURE

## 2024-04-15 NOTE — ED Provider Notes (Signed)
 Patient: Christopher [name]  DOB: [birth year]  MRN: [id number] PMD: [facility]    Chief Complaint and Triage note per EDRN:   Chief Complaint   Patient presents with    Alcohol  Problem     Pt BIBA from home c/o withdrawals from etoh and chest discomfort. Pt hasn't had a drink in 4 days. PMH of afib       ED Provider Note  History of Present Illness     Christopher [name] is a 33 year old  male with hx frequent ED visits who presents to [place] ED for reports of alcohol  withdrawal.    Contrary to triage note, the patient does not report chest discomfort during my history.    History of Present Illness  The patient is a 33 year old male with a history of alcohol  withdrawal and atrial fibrillation, presenting for alcohol  withdrawal. He was admitted yesterday for 11 hours due to withdrawal symptoms. The patient has abstained from alcohol  for the past four days, with his last consumption being on April 11, 2024, when he drank a fifth of whiskey. He previously stayed sober for four months last year, from March 2024 to July 2024.   Patient denies hx alcohol  withdrawal seizures.    SOCIAL HISTORY  - Drank one-fifth of whiskey per day before stopping 4 days ago    This patient arrived by EMS. I have received a verbal report directly to me as to the care prior to arrival including: on scene events, vital signs, glucose and medications administered. Information obtained from EMS: No seizure in route.    recent discharge summary from (institution & date) yesterday hospitalist [facility]. Information obtained: patient's withdrawal symptoms improved significantly during hospitalization and he eloped from the hospital at about 1700. .    Medical History     Past Medical History[1]  Past Surgical History[2]  Family History[3]  none     Socioeconomic History    Marital status: Single   Tobacco Use    Smoking status: Unknown     Medications:  No current outpatient medications  Allergies:  Patient has no known allergies.  ROS: All other systems reviewed and negative unless noted in the HPI above.   Click heading Objective to see physical exam  Physical Exam   Objective    Vital signs/nursing note reviewed and noted:         04/14/24  1135 04/14/24  1245   BP: 172/100 155/100   Pulse: 64 74   Temp: 97.9 F (36.6 C)    Resp: 18 18   SpO2: 98% 98%          Constitutional: The patient is awake and alert, not diaphoretic. Malodorous.     Head: Normocephalic and atraumatic.      Eyes: No scleral icterus. Right eye: No discharge. Left eye: No discharge.      Ears: Right: External ear normal. Left: External ear normal.      Nose: No nasal deformity. Mouth: Mucous membranes are moist. Uvula is midline, no tonsillar swelling or exudates, not drooling, no stridor, no hot potato voice, no trismus, no swelling at the root of the tongue and no tongue elevation .  No tongue fasciculations.     Neck: Trachea midline. Cervical spine: Normal range of motion and neck supple. No midline tenderness, No step-off     Cardiovascular:      Normal rate and regular rhythm. Heart sounds: No murmur heard  Radial pulses are present bilaterally and symmetric. Dorsalis pedis pulses are present bilaterally and symmetric.     Pulmonary:      Pulmonary effort is normal. No respiratory distress.      Normal breath sounds. No wheezing or rales.           Abdominal:      Appearance: There is no distension.      Palpation: Abdomen is soft. There is no abdominal tenderness, there is no guarding, rebound, or rigidity.      Flank: There is no CVA tenderness.     Back:     Thoracic back: No bony tenderness, No step-offs. Lumbar back: No bony tenderness, No step-offs.     Extremity:         Feet are soiled without lacerations or abrasions. No deformity. Normal range of motion. Right lower leg: No edema. Left lower leg: No edema.     Skin:     Skin is warm and dry. Skin is not jaundiced. No rash.      Neurologic:      Not tremulous. Speech  clear, face symmetric, No focal deficit present. The patient is alert and oriented to person, place, and time. Cranial nerves 2-12 intact, Upper extremities held at 90 degrees without drift., Lower extremities held at 30 degrees without difficulty, Sensation is described normal over all extremities, and No cerebellar deficits with normal finger to nose            Radiologist Interpretation of Radiology Studies     No orders to display         EDMD Diagnostic Study Interpretation       Orders and Medications Given   Initial workup includes lab work listed with significant results in ED course below:  Orders Placed This Encounter   Procedures    CMP    Syphilis Screen, Blood Yellow serum separator tube    CBC w/ Diff Lavender    Prothrombin Time, Blood Blue     Initial treatment included:  Medications   PHENobarbital  injection 260 mg (260 mg IntraVENOUS Given 04/14/24 1222)     Impression and MDM      In summary, Christopher [name] is a 33 year old  male with hx frequent ED visits who presents to [place] ED for reports of alcohol  withdrawal.    Differential:  Clinically with mild withdrawal, no tongue fasciculations and mild hypertension with normal heart rate. Given phenobarb x1 dose but no clinical indication for re-admission to the hospital.   Mild dehydration/hypokalemia similar to yesterday, tolerating PO.  No leukocytosis, no fever  No evidence of GI bleed, no anemia    The encounter diagnosis was Alcohol  withdrawal syndrome without complication (CMS-HCC).    Patient was re-assessed by EDMD, and at that time symptoms improved after treatment and agreeable to discharge with close outpatient follow-up, instructions discussed and strict return to ED precautions were emphasized    Dx/tx limited by SDOH: Problems related to housing and economic: Problems related to housing Homelessness. These factors potentially limit the patient's ability to obtain adequate follow up and/or comply with treatment.      ED Course and  Updates   Please see workup review below for ED course, updates regarding laboratory and imaging findings, as well as disposition.  ED Clinical Impression as of 04/15/24 1345   Alcohol  withdrawal syndrome without complication (CMS-HCC)             Disposition: Discharge  Caprice EMERSON Niemann, MD  Emergency Medicine  [facility]    Voice recognition software may have been used in the preparation of this note, therefore you may see significant substitutions or omissions despite careful editing.    This medical note may have been generated with the assistance of AI technology. If this AI technology was used, then the following applies: Patient was given information prior to start of visit and allowed to opt out of AI use. Passive/Verbal consent to record this visit was thus obtained from the patient and, where applicable, from any accompanying individuals. Attending Emergency Physician reviewed the information documented in this note to ensure that it is accurate and reflects any discussions, diagnoses, treatments, and care plans agreed upon with the patient during this encounter.     Suggested coding:    Amount & Complexity of Data Reviewed: Extensive. The encounter included ordering and independent review of at least three unique laboratory tests (CMP, CBC with differential, prothrombin time, syphilis screen), as well as review of a recent external discharge summary.   The documentation also references review of prior records and integration of information from an external source (recent discharge summary), which counts as a unique source. This satisfies Extensive Category 1 (any combination of 3 from review of prior external notes, review of results of each unique test, or ordering of each unique test).  Risk of Complications & Morbidity/Mortality: High. The patient was administered intravenous phenobarbital , a parenteral DEA-controlled substance, which is specifically listed as a high-risk intervention in  the 2023 [facility] E/M Guidelines. The risk is further elevated by the patient's social determinants of health (homelessness, unreliable follow-up), recent elopement from inpatient care, and the need for close monitoring for potential complications of withdrawal. The decision to administer parenteral phenobarbital  and the context of recent hospital-level care for the same issue both support high risk.         [1] No past medical history on file.  [2] No past surgical history on file.  [3] No family history on file.       Niemann Caprice Lopes, MD  04/17/24 [phone]

## 2024-05-14 ENCOUNTER — Encounter (HOSPITAL_COMMUNITY): Payer: Self-pay

## 2024-05-14 ENCOUNTER — Inpatient Hospital Stay
Admission: EM | Admit: 2024-05-14 | Discharge: 2024-05-22 | DRG: 897 | Disposition: A | Discharge status: Home / Self Care | Attending: Psychiatry | Admitting: Psychiatry

## 2024-05-14 ENCOUNTER — Emergency Department (HOSPITAL_COMMUNITY)

## 2024-05-14 DIAGNOSIS — I10 Essential (primary) hypertension: Secondary | ICD-10-CM | POA: Diagnosis present

## 2024-05-14 DIAGNOSIS — Z818 Family history of other mental and behavioral disorders: Secondary | ICD-10-CM

## 2024-05-14 DIAGNOSIS — F10220 Alcohol dependence with intoxication, uncomplicated: Principal | ICD-10-CM | POA: Diagnosis present

## 2024-05-14 DIAGNOSIS — F1020 Alcohol dependence, uncomplicated: Secondary | ICD-10-CM | POA: Insufficient documentation

## 2024-05-14 DIAGNOSIS — F319 Bipolar disorder, unspecified: Secondary | ICD-10-CM | POA: Diagnosis present

## 2024-05-14 DIAGNOSIS — R4182 Altered mental status, unspecified: Secondary | ICD-10-CM

## 2024-05-14 DIAGNOSIS — Z79899 Other long term (current) drug therapy: Secondary | ICD-10-CM

## 2024-05-14 DIAGNOSIS — E1165 Type 2 diabetes mellitus with hyperglycemia: Secondary | ICD-10-CM

## 2024-05-14 DIAGNOSIS — F1994 Other psychoactive substance use, unspecified with psychoactive substance-induced mood disorder: Secondary | ICD-10-CM | POA: Diagnosis present

## 2024-05-14 DIAGNOSIS — Z9151 Personal history of suicidal behavior: Secondary | ICD-10-CM

## 2024-05-14 DIAGNOSIS — F32A Depression, unspecified: Secondary | ICD-10-CM | POA: Insufficient documentation

## 2024-05-14 DIAGNOSIS — R45851 Suicidal ideations: Secondary | ICD-10-CM | POA: Diagnosis present

## 2024-05-14 DIAGNOSIS — Z5902 Unsheltered homelessness: Secondary | ICD-10-CM

## 2024-05-14 DIAGNOSIS — I471 Supraventricular tachycardia, unspecified: Secondary | ICD-10-CM | POA: Diagnosis present

## 2024-05-14 DIAGNOSIS — Y908 Blood alcohol level of 240 mg/100 ml or more: Secondary | ICD-10-CM

## 2024-05-14 DIAGNOSIS — Z7984 Long term (current) use of oral hypoglycemic drugs: Secondary | ICD-10-CM

## 2024-05-14 DIAGNOSIS — F10920 Alcohol use, unspecified with intoxication, uncomplicated: Principal | ICD-10-CM

## 2024-05-14 DIAGNOSIS — I480 Paroxysmal atrial fibrillation: Secondary | ICD-10-CM | POA: Diagnosis present

## 2024-05-14 DIAGNOSIS — Z781 Physical restraint status: Secondary | ICD-10-CM

## 2024-05-14 DIAGNOSIS — R7401 Elevation of levels of liver transaminase levels: Secondary | ICD-10-CM

## 2024-05-14 DIAGNOSIS — G4700 Insomnia, unspecified: Secondary | ICD-10-CM | POA: Diagnosis not present

## 2024-05-14 DIAGNOSIS — R4589 Other symptoms and signs involving emotional state: Secondary | ICD-10-CM

## 2024-05-14 DIAGNOSIS — E119 Type 2 diabetes mellitus without complications: Secondary | ICD-10-CM

## 2024-05-14 DIAGNOSIS — F419 Anxiety disorder, unspecified: Secondary | ICD-10-CM | POA: Diagnosis present

## 2024-05-14 DIAGNOSIS — F10229 Alcohol dependence with intoxication, unspecified: Secondary | ICD-10-CM

## 2024-05-14 DIAGNOSIS — F10230 Alcohol dependence with withdrawal, uncomplicated: Secondary | ICD-10-CM | POA: Diagnosis not present

## 2024-05-14 DIAGNOSIS — F515 Nightmare disorder: Secondary | ICD-10-CM

## 2024-05-14 HISTORY — DX: Type 2 diabetes mellitus without complications: E11.9

## 2024-05-14 HISTORY — DX: Depression, unspecified: F32.A

## 2024-05-14 HISTORY — DX: Unspecified atrial fibrillation (CMS-HCC): I48.91

## 2024-05-14 LAB — COMPREHENSIVE METABOLIC PANEL, BLOOD
ALT: 106 U/L — ABNORMAL HIGH (ref 0–41)
ANION: 14 mmol/L (ref 7–15)
AST: 75 U/L — ABNORMAL HIGH (ref 0–40)
Albumin: 3.9 g/dL (ref 3.5–5.2)
Alkaline Phosphatase: 81 U/L (ref 40–129)
BUN: 6 mg/dL (ref 6–20)
Bicarbonate: 28 mmol/L (ref 22–29)
Bilirubin Total: 0.2 mg/dL (ref <1.2)
Calcium: 8.5 mg/dL (ref 8.5–10.6)
Chloride: 106 mmol/L (ref 98–107)
Creatinine: 1.11 mg/dL (ref 0.67–1.17)
Glucose: 233 mg/dL — ABNORMAL HIGH (ref 60–99)
Potassium: 3.4 mmol/L — ABNORMAL LOW (ref 3.5–5.1)
Sodium: 148 mmol/L — ABNORMAL HIGH (ref 136–145)
Total Protein: 7.7 g/dL (ref 6.0–8.0)
eGFR based on CKD-EPI 2021 equation: >60 mL/min/1.73 m2

## 2024-05-14 LAB — CBC WITH DIFF, BLOOD
ANC-Automated: 1.4 1000/mm3 — ABNORMAL LOW (ref 1.6–7.0)
Abs Basophils: 0 1000/mm3 (ref <0.2)
Abs Eosinophils: 0 1000/mm3 (ref 0.0–0.5)
Abs Lymphs: 3.1 1000/mm3 (ref 0.8–3.1)
Abs Monos: 0.4 1000/mm3 (ref 0.2–0.8)
Basophils: 0.8 %
Eosinophils: 0.2 %
Hct: 41.6 % (ref 40.0–50.0)
Hgb: 13.6 g/dL — ABNORMAL LOW (ref 13.7–17.5)
Imm Gran %: 0.2 % (ref <1)
Imm Gran Abs: 0 1000/mm3 (ref <0.1)
Lymphocytes: 63 %
MCH: 27.5 pg (ref 26.0–32.0)
MCHC: 32.7 g/dL (ref 32.0–36.0)
MCV: 84 um3 (ref 79.0–95.0)
MPV: 10.2 fL (ref 9.4–12.4)
Monocytes: 7.1 %
Plt Count: 234 1000/mm3 (ref 140–370)
RBC: 4.95 mill/mm3 (ref 4.60–6.10)
RDW: 13.3 % (ref 12.0–14.0)
Segs: 28.7 %
WBC: 4.9 1000/mm3 (ref 4.0–10.0)

## 2024-05-14 LAB — UR DRUGS OF ABUSE SCREEN
Amphetamines Screen: NEGATIVE
Barbiturates Screen: NEGATIVE
Benzodiazepine Screen: POSITIVE
Cocaine Screen: NEGATIVE
Opiates Screen: NEGATIVE
THC Screen: NEGATIVE
UR Fentanyl Screen: NEGATIVE

## 2024-05-14 LAB — ALCOHOL, BLOOD: Alcohol: 590 mg/dL

## 2024-05-14 MED ORDER — SODIUM CHLORIDE 0.9 % IV SOLN
Freq: Once | INTRAVENOUS | Status: AC
Start: 2024-05-14 — End: 2024-05-15

## 2024-05-14 MED ORDER — THIAMINE HCL 100 MG/ML IJ SOLN
100.0000 mg | Freq: Once | INTRAMUSCULAR | Status: AC
Start: 2024-05-14 — End: 2024-05-14
  Administered 2024-05-14: 100 mg via INTRAVENOUS
  Filled 2024-05-14: qty 2

## 2024-05-14 MED ORDER — SODIUM CHLORIDE 0.9 % IV SOLN
Freq: Once | INTRAVENOUS | Status: AC
Start: 2024-05-14 — End: 2024-05-14

## 2024-05-14 NOTE — ED Notes (Signed)
 Report to Bernida PEAK and Radio producer ; transferred pt care.

## 2024-05-14 NOTE — ED Notes (Addendum)
 From vacant apartment complex, PD called out by mother due to statements of jumping off the [facility]. Pt on 5150 DTS placed by PD. 5mg  Versed IM, 300 mL NS given. +alcohol . Possible Librium taken at home. Pt arrived in restraints, per Dameff ordered to be taken off.

## 2024-05-14 NOTE — ED Notes (Signed)
 Patient back from CT

## 2024-05-14 NOTE — ED Notes (Signed)
 Lab at bedside

## 2024-05-14 NOTE — ED Notes (Signed)
 PT intermittently waking and asking for phone, 5150 status explained to PT with limited understanding as PT isn't entirely alert.

## 2024-05-14 NOTE — ED Notes (Addendum)
Patient to CT with sitter

## 2024-05-15 DIAGNOSIS — F10920 Alcohol use, unspecified with intoxication, uncomplicated: Secondary | ICD-10-CM

## 2024-05-15 DIAGNOSIS — R451 Restlessness and agitation: Secondary | ICD-10-CM

## 2024-05-15 MED ORDER — PHENOBARBITAL SODIUM 130 MG/ML IJ SOLN
130.0000 mg | Freq: Once | INTRAMUSCULAR | Status: AC
Start: 2024-05-15 — End: 2024-05-15
  Administered 2024-05-15: 130 mg via INTRAVENOUS
  Filled 2024-05-15: qty 1

## 2024-05-15 MED ORDER — NALOXONE HCL 0.4 MG/ML IJ SOLN
0.1000 mg | INTRAMUSCULAR | Status: DC | PRN
Start: 2024-05-15 — End: 2024-05-17

## 2024-05-15 NOTE — ED Notes (Signed)
 Pt ambulated well to restroom without assistance. Slight gait disturbance, but balanced and steady.

## 2024-05-15 NOTE — Consults (Signed)
 Psychiatry Consult Note    Date of Admission: 05/14/2024  Consulting Attending: Kingston Caprice Lopes, MD  Reason for Consult: Alcohol  intoxication, agitation     History of Present Illness:     Christopher [name] is a 33 year old male with history of bipolar disorder, alcohol  use disorder, atrial fibrillation, diabetes mellitus admitted for alcohol  intoxication and agitation.    The patient states that he can not remember what happened yesterday.  He knows that he was drinking and eventually got picked up by police being agitated. He drinks regularly hard liquor. He knows that he has an alcohol  problem but he does not see a purpose in going to a rehab facility. Is in the process of trying to find a job and has been applying to remote jobs at [employer] and other institution but has not been successful so far. Things that by getting a job he would be able to stop drinking. He has been homeless but is currently living in an apartment that belongs to his mother. He denies suicidal or homicidal ideations and denies auditory or visual hallucinations.    Collateral information (mother Dedra, [phone]): The patient has been very ill for many months. He has been erratic and paranoid. He has been endorsing that he is part of a gang, which is not true. He has been acting differently and threatening to family members. He is driving recklessly and went to his grandmother, threatening her also. He has been having many episodes of suicidal threats. She is very scared that he will hurt someone. He acts grandiose and has no sense of reality. She knows that something really bad will happen.    Past Psychiatric History:     1) Diagnoses: Bipolar disorder, depression, alcohol  use disorder  2) Suicide attempts: Suicide attempts in the past, last June 25, hospitalization [facility]   3) Inpatient Hospitalizations: [facility], [facility], [facility]   4) Outpatient treatment/psychiatrist: denied   5) Medication Trials:  denied  6)The patient is unwilling to answer questions regarding psychological trauma.    Substance History:  ETOH- 4 times/week, current withdrawals. History of 4 months sobriety. No history of attending SUD related programs.     Review of Systems - All others negative    Medical History:   Problem List[1]    Allergies:   Allergies[2]    Medications:   Scheduled:      PRN:   nalOXone   0.1 mg Q2 Min PRN       Social History(per patient and chart review):  Marital: Single, no children  Income: Unemployed, [employer]  Housing: Lives with family  Education: Some Comptroller: No history  Religion: Christian     Family History:   Denied                 Vital Signs:  Blood pressure 110/70, pulse 64, temperature 97.9 F (36.6 C), resp. rate 16, weight 126.1 kg (278 lb), SpO2 96%.    Mental Status Examination:   Appearance: Disheveled, wearing   Behavior: Calm cooperative   Motor/Abnormal Involuntary Movements: No signs of abnormal involuntary movement   Gait: not assessed   Speech: clear with normal rate and volume     Mood: Okay   Affect: Constricted   Thought Process: coherent, logical  Associations: linear and goal-directed   Thought Content: No signs of delusional thinking, denies suicidal or homicidal ideations   Perceptions: no abnl  Insight/Judgment: Chiropractor, Orientation & Intellectual Functions:  Alert, oriented x3    Pertinent Studies/Labs:   WBC 4.9 (10/10) HGB 13.6* (10/10) PLT 234 (10/10)    HCT 41.6 (10/10)      %Neutrophils 28.7 (10/10) %Bands   %Lymphs 63.0 (10/10) %Monos 7.1 (10/10) %Eos 0.2 (10/10) %Basos 0.8 (10/10)  Na 148* (10/10) CL 106 (10/10) BUN 6 (10/10) GLU   233* (10/10)   K 3.4* (10/10) CO2 28 (10/10) Cr 1.11 (10/10)      Ca 8.5 (10/10) Mg   Phos    TP 7.7 (10/10) ALT 106* (10/10) TBILI 0.2 (10/10) ALK PHOS  81 (10/10)   ALB 3.9 (10/10) AST 75* (10/10) DBILI        PT   PTT     INR       Urinalysis  pH   Spec Grav   Glucose   Ketones     Blood   Protein   Urobilinogen   Leuk  Est     Nitrite   WBCs   RBC   Bacteria     Epith Cells   Crystals   Comments        ABG: pH  PCO2   PO2   FiO2    VBG: pH  PCO2   PO2      ESR   CRP    Lactate    ANC 1.4* (10/10)  Iron   Ferritin   Iron Sat   UIBC   TIBC    Cholesterol   HDL   LDL   Triglycerides    TSH   FT4    A1c 7.4* (09/09)  CD4   VIRAL LOAD    LDH   URIC ACID     C3   C4   Anti-DSDNA          Lab Results   Component Value Date    AMPCLASS Negative 05/14/2024    BARBCLASS Negative 05/14/2024    BENZYLCGNN Negative 05/14/2024    BENZDIAZCL Positive 05/14/2024    OPIATESCL Negative 05/14/2024    TETCANNABIN Negative 05/14/2024        Lab Results   Component Value Date    ETOH 590 05/14/2024       Clinical Global Impression Scale:  4= Moderately ill  :Suggested Guidelines= Overt symptomatology causing noticeable, but modest, functional impairment.     Narrative Assessment: Christopher [name] is a 33 year old male with history of alcohol  use disorder admitted for alcohol  intoxication and agitation. The metabolized the alcohol  the patient appears to be in a calmer state. He denies suicidal homicidal ideations. He appears to be have only limited insight into his alcohol  use disorder and the need for detox and alcohol  rehab. He continues to act erratically and paranoid. Patient meets criteria for psychiatric hold and psychiatric inpatient admission.     A comprehensive suicide risk assessment was performed and the patient was assessed to be at a low acute risk of self-harm.  Modifiable risk factors include precipitating stressors and intoxication.  Non-modifiable risk factors include existing psychiatric diagnoses, male gender, and single status.  The patient also has protective factors of future life plans, coping skills, and access to health care.    A violence risk assessment was also performed. The following behavioral risk factors are associated with acute violence risk and have been present within the past 24 hours: impulsivity. Based on  these factors, this patient's acute risk of violence in the inpatient setting in the next 24 hours is assessed  to be intermediate. Historical (non-modifiable) risk factors for violence in this patient include: prior violent acts.      DSM-5 Diagnoses:  Agitation  Alcohol  use disorder, dependence    Recommendations:  - Disposition plan: Patient meets criteria for psychiatric hold and psychiatric inpatient admission. SW to start outpatient bed search.  - Scheduled medications: None  - PRN medications (for agitation/anxiety): None  - Medical clearance concerns: Her ED team  - Legal status: 5150 DTS, exp. 05/17/2024 at 1810  - Level of observation: level 2  - Outpatient provider not contacted     For medical clearance for inpatient psychiatric admission: In order to be medically stable for [facility]BM[facility] or any outside hospital psychiatric unit, patient needs to have no lines/tubes (including no urinary catheters), be off oxygen, not require IV fluids or medications, be able to eat/drink, be able to void independently, be able to ambulate safely or independently use an assistive device safety, and be stable enough to require vital sign monitoring no more frequently than every 8 hours.    Thank you for this consult. Please page psychiatry if additional questions. If patient is in the [facility] emergency department, page [phone] to reach the psychiatry ED resident. If patient is on a [facility] inpatient service, page [phone] to reach the inpatient psychiatry consult team. If the patient is in the [facility], please consult the paging website for the provider on call.            [1]   Patient Active Problem List  Diagnosis    Alcohol  withdrawal syndrome, uncomplicated (CMS-HCC)   [2] No Known Allergies

## 2024-05-15 NOTE — ED Provider Notes (Signed)
 ED Provider Note  [facility] electronic medical record reviewed for pertinent medical history.     Christopher [name] DOB: 28-Nov-1990 PMD: [facility], [facility]     ED Arrival Information       Expected   -    Arrival   05/14/2024 18:24    Acuity   Emergent/ESI Level 2              Means of arrival   Paramedic Unit    Escorted by   -    Service   ED Medicine    Admission type   Emergency              Arrival complaint   Agitation             Chief Complaint   Patient presents with   . Agitation     Pt BIBA [place] [employer] and [employer] due to agitation. Pt was at [facility] earlier today due to ETOH. Medics arrived on scene as was combative with PD and medics. Pt restrained by medics and given 5mg  versed IM. Pt placed on 5150 DTS.        HPI: Christopher [name] who has a past medical history of alcoholism presents in police custody via EMS with reports of alcohol  intoxication. Patient reportedly violent and combative with police and medics. Chemically restrained with 5 mg of Versed. Police placed patient on a 5150 danger to self. Patient is lethargic and will respond to noxious stimuli moving all extremities but will not cooperate with history gathering likely secondary to chemical sedation.        External Data Sources (Select all that apply):      Pertinent Medical History:    PMHx: As above    Past Surgical History[1]    Family History[2]    No current outpatient medications    Physical Exam  BP 152/87   Pulse 77   Temp 98.5 F (36.9 C)   Resp 15   Wt 126.1 kg (278 lb)   SpO2 96%   BMI 33.84 kg/m   Physical Exam  Vitals and nursing note reviewed.   Constitutional:       General: He is not in acute distress.     Appearance: He is obese. He is not ill-appearing or diaphoretic.   HENT:      Head: Normocephalic and atraumatic.      Nose: Nose normal.      Mouth/Throat:      Mouth: Mucous membranes are moist.   Eyes:      Extraocular Movements: Extraocular movements intact.       Conjunctiva/sclera: Conjunctivae normal.      Pupils: Pupils are equal, round, and reactive to light.   Cardiovascular:      Rate and Rhythm: Regular rhythm. Tachycardia present.      Pulses: Normal pulses.      Heart sounds: Normal heart sounds. No murmur heard.     No gallop.   Pulmonary:      Effort: Pulmonary effort is normal.      Breath sounds: Normal breath sounds.   Abdominal:      General: Abdomen is flat. There is no distension.      Palpations: Abdomen is soft.      Tenderness: There is no abdominal tenderness. There is no guarding.   Musculoskeletal:         General: No swelling, tenderness or signs  of injury. Normal range of motion.      Cervical back: Neck supple. No rigidity or tenderness.   Skin:     General: Skin is warm.      Findings: No erythema or rash.   Neurological:      General: No focal deficit present.      Mental Status: He is unresponsive.      Comments: Moves all extremities to noxious stimuli.    Psychiatric:         Behavior: Behavior normal.           Orders/Medications    Orders Placed This Encounter   Procedures   . CT Head W/O Contrast   . UR Drugs of Abuse Screen   . CBC w/ Diff Lavender   . CMP   . Alcohol , Blood Green Plasma Separator Tube   . ECG 12 Lead       Medications   sodium chloride  0.9 % 1,000 mL IV bolus ( IntraVENOUS Stopped 05/14/24 2059)   thiamine  (VITAMIN B1) injection 100 mg (100 mg IntraVENOUS Given 05/14/24 2125)   sodium chloride  0.9 % 1,000 mL IV bolus ( IntraVENOUS New Bag 05/14/24 2125)           Medical Decision Making/Assessment/Plan    This is a(n) 33 year old [name] who has a past medical history of alcohol  intoxication brought in on 5150 by police and medics with reports of alcohol  intoxication, combative violent behavior requiring chemical sedation. Patient presents lethargic and unresponsive but does withdraw to painful stimuli in all extremities. Patient smells of alcohol . Vital signs are stable. Patient is not hypoglycemic. Laboratory studies show  hyperglycemia without signs of acidosis. Patient has a transaminitis that is similar to prior. Alcohol  level is significantly elevated at 590. No significant leukocytosis or anemia. CT scan of the head without contrast demonstrates no acute intracranial abnormality. Patient observed for several hours with improvement in his mental status. When I re-evaluated this patient he was alert, oriented and had goal-directed thought. Will await psychiatric evaluation for disposition.          ED Course/Updates/Disposition  ED Course as of 05/16/24 1412   Others' Documentation   Sat May 15, 2024   0202 33 yo M hx of alcohol  use disorder, here on 5150 for DTS. Pending metab, psych to see in am. [RN]   0620 S/o RN  33 y/o [name] hx alcohol  use disorder on 5150 for DTS. His alcohol  was 590. He has been metabolizing.   -Monitor for withdrawal  -Psych [NK]   [id number] D/w Psychiatry Dr. Clemetine who will evaluate and consult on patient   [NK]   0932 Stable vitals, no signs of alcohol  withdrawal [NK]   1049 Psych : Patient seems to have underlying bipolar disorder and psychosis, complicated with alcohol  use disorder, very erratic behavior. Patient will need inpatient admission.  [NK]   1408 33 5150 danger to self, prob admission [AL]   Sun May 16, 2024   0615 On 5150 for dts. Here after agitation. Heavy etoh use. Withdrew here, s/p phenobarb. Pending bed search.  [MN]   1345 The patient currently exhibiting no signs of alcohol  withdrawal. Vitals signs remained stable. I last evaluated him a few minutes ago and he is calm/comfortable. He does have hyperglycemia, but not in DKA. Also not severely elevated. I noted on upon chart review that he has a history of paroxysmal AFib as well as diabetes, but he is not currently on anticoagulation (given  medication noncompliance).    Patient is currently medically cleared. [MN]   1410 S/o Navarro: 33 yo M hx of alcohol  use, DM, paroxysmal afib, not medication compliant, 5150 for danger to self,  medically clear.  Admitting to [facility] for psych.  May need to watch for withdrawal.    [AC]      ED Course User Index  [AC] Laurence Sharyne Bison, MD  [AL] Kasey Thersia Dragon, DO  [MN] Kirk Ozell Dade, MD  [NK] Kingston Caprice Lopes, MD  [RN] Waneta Sammi Apple, MD, PhD         ED Clinical Impression as of 05/16/24 1412   Alcoholic intoxication without complication   At high risk for self harm                           Data Reviewed:  Ct head (interpreted by me): negative for acute findings  EKG (interpreted by me): No STEMI  Limited Bedside Ultrasound Procedure:   All images archived on [facility] servers.        Risk of Complications and/or Morbidity:                   Davina Ana Woodroof [place], MD  05/15/24 0120       [1]  No past surgical history on file.  [2]  No family history on file.

## 2024-05-15 NOTE — ED OBS HPI (Signed)
 ED OBS HPI/Admission Note    Please see provider note for details. Pt informed they are in ED Observation Status.    Reason for ED Observation:  33 year old male presents to the emergency department on a 5150 for danger to self    Plan while in ED Observation:  Observe overnight, allow to metabolize, follow up psychiatry recommendations in the morning

## 2024-05-15 NOTE — Interdisciplinary (Addendum)
 ED SOCIAL WORK NOTE:      REASON: Inpatient Bed Search  LEGAL STATUS: 5150 DTS  LOCATION: ED Bed 13  PROVIDER: Emergency Medicine  ------------------------------------------------     [facility] [phone], [place] [address])  Phone: [phone]  Fax: [phone]  Update: SW called [facility] who informed SW that beds are available, will review clinicals and call back, may be a few hours.  Update 5:30pm: SW called for update, clinicals have not been reviewed yet.     [facility] ([address] [place], [place] [address])  Phone: [phone]  Fax: [phone]  Update: SW called [facility] who informed SW that no beds are available, however will review clinicals.   Update 5:30pm: No beds available.     [facility] ([address], [place], [place] [address])  Phone: [phone]  Fax: [phone]  Update: One male bed available, will review clinicals to ensure pt is appropriate for unit.   Update 5:30pm: No beds today, will call tomorrow if any openings.     Update 5:30pm: SW sent clinicals to:  [facility] ([address] [place], [place], [place] [address])  Phone: [phone]  Fax: [phone]  Pending Response;     CONTACT:   Danette Sing, ASW  ED Social Work - [employer]  [phone]

## 2024-05-15 NOTE — ED Notes (Signed)
Phone returned to pt.

## 2024-05-15 NOTE — ED Notes (Signed)
 Dr note faxed to [facility].     Intake:  [phone] [id number]

## 2024-05-15 NOTE — ED Notes (Signed)
 Food and fluids offered. Pt tolerated well. No nausea or vomiting.

## 2024-05-15 NOTE — ED Notes (Addendum)
 Assumed care of patient, on (5150) asleep on right side in street clothes, sitter in direct line of site of patient. Pt here with SI, etoh, and aggressive bx. when arrived in restraints yesterday evening. Per report pt was re-direct able and following instructions.  Pt asleep respiratory assessment completed.  Waiting to be evaluated by psychiatric team.     Sitter:  In direct line of site of pt.     Sitter:  Instructed to call RN for assistance.

## 2024-05-16 ENCOUNTER — Encounter (HOSPITAL_COMMUNITY): Payer: Self-pay

## 2024-05-16 DIAGNOSIS — F10920 Alcohol use, unspecified with intoxication, uncomplicated: Secondary | ICD-10-CM

## 2024-05-16 DIAGNOSIS — F319 Bipolar disorder, unspecified: Secondary | ICD-10-CM | POA: Diagnosis present

## 2024-05-16 LAB — SARS-COV-2, INFLUENZA A/B, AND RSV PCR, RRL
Influenza A PCR, RRL: NOT DETECTED
Influenza B PCR, RRL: NOT DETECTED
Respiratory Syncytial Virus (RSV) PCR, RRL: NOT DETECTED
SARS-CoV-2 PCR, RRL: NOT DETECTED

## 2024-05-16 LAB — GLUCOSE POCT, BLOOD: Glucose (POCT): 126 — ABNORMAL HIGH (ref 70–100)

## 2024-05-16 MED ORDER — LORAZEPAM 1 MG OR TABS
1.0000 mg | ORAL_TABLET | ORAL | Status: DC | PRN
Start: 2024-05-16 — End: 2024-05-17
  Administered 2024-05-17: 1 mg via ORAL
  Filled 2024-05-16: qty 1

## 2024-05-16 MED ORDER — LORAZEPAM 2 MG/ML OR CONC
1.0000 mg | ORAL | Status: DC | PRN
Start: 2024-05-16 — End: 2024-05-16
  Administered 2024-05-16: 1 mg via ORAL
  Filled 2024-05-16: qty 1

## 2024-05-16 MED ORDER — ALUM & MAG HYDROXIDE-SIMETH 200-200-20 MG/5ML OR SUSP
30.0000 mL | Freq: Four times a day (QID) | ORAL | Status: DC | PRN
Start: 2024-05-16 — End: 2024-05-22

## 2024-05-16 MED ORDER — PHENOBARBITAL SODIUM 130 MG/ML IJ SOLN
260.0000 mg | INTRAMUSCULAR | Status: AC
Start: 2024-05-16 — End: 2024-05-16
  Administered 2024-05-16 (×2): 260 mg via INTRAVENOUS
  Filled 2024-05-16 (×2): qty 2

## 2024-05-16 MED ORDER — PHENOBARBITAL SODIUM 130 MG/ML IJ SOLN
260.0000 mg | INTRAMUSCULAR | Status: AC
Start: 2024-05-16 — End: 2024-05-16
  Administered 2024-05-16: 260 mg via INTRAVENOUS
  Filled 2024-05-16: qty 1
  Filled 2024-05-16: qty 2

## 2024-05-16 MED ORDER — LORAZEPAM 1 MG OR TABS
1.0000 mg | ORAL_TABLET | Freq: Once | ORAL | Status: AC
Start: 2024-05-16 — End: 2024-05-16
  Administered 2024-05-16: 1 mg via ORAL
  Filled 2024-05-16: qty 1

## 2024-05-16 MED ORDER — THIAMINE HCL 100 MG OR TABS (CUSTOM)
100.0000 mg | ORAL_TABLET | Freq: Every day | ORAL | Status: DC
Start: 2024-05-17 — End: 2024-05-22
  Administered 2024-05-17 – 2024-05-22 (×6): 100 mg via ORAL
  Filled 2024-05-16 (×6): qty 1

## 2024-05-16 MED ORDER — MAGNESIUM HYDROXIDE 400 MG/5ML OR SUSP
30.0000 mL | Freq: Every evening | ORAL | Status: DC | PRN
Start: 2024-05-16 — End: 2024-05-22

## 2024-05-16 MED ORDER — LORAZEPAM 1 MG OR TABS
1.0000 mg | ORAL_TABLET | ORAL | Status: DC | PRN
Start: 2024-05-16 — End: 2024-05-16

## 2024-05-16 MED ORDER — METFORMIN HCL 500 MG OR TB24
500.0000 mg | ORAL_TABLET | Freq: Two times a day (BID) | ORAL | Status: DC
Start: 2024-05-16 — End: 2024-05-22
  Administered 2024-05-17 – 2024-05-22 (×11): 500 mg via ORAL
  Filled 2024-05-16 (×11): qty 1

## 2024-05-16 MED ORDER — LISINOPRIL 40 MG OR TABS
20.0000 mg | ORAL_TABLET | Freq: Every day | ORAL | Status: DC
Start: 2024-05-17 — End: 2024-05-19
  Administered 2024-05-17 – 2024-05-19 (×3): 20 mg via ORAL
  Filled 2024-05-16 (×3): qty 1

## 2024-05-16 MED ORDER — OLANZAPINE ODT 5 MG OR TBDP
5.0000 mg | ORAL_TABLET | ORAL | Status: DC | PRN
Start: 2024-05-16 — End: 2024-05-17

## 2024-05-16 MED ORDER — HYDROXYZINE HCL 25 MG OR TABS
25.0000 mg | ORAL_TABLET | Freq: Once | ORAL | Status: AC
Start: 2024-05-16 — End: 2024-05-16
  Administered 2024-05-16: 25 mg via ORAL
  Filled 2024-05-16: qty 1

## 2024-05-16 MED ORDER — ACETAMINOPHEN 325 MG PO TABS
650.0000 mg | ORAL_TABLET | ORAL | Status: DC | PRN
Start: 2024-05-16 — End: 2024-05-22
  Administered 2024-05-20 – 2024-05-21 (×3): 650 mg via ORAL
  Filled 2024-05-16 (×3): qty 2

## 2024-05-16 MED ORDER — HYDROXYZINE HCL 25 MG OR TABS
25.0000 mg | ORAL_TABLET | ORAL | Status: DC | PRN
Start: 2024-05-16 — End: 2024-05-17

## 2024-05-16 MED ORDER — HYDROXYZINE HCL 25 MG OR TABS
25.0000 mg | ORAL_TABLET | ORAL | Status: DC | PRN
Start: 2024-05-16 — End: 2024-05-17
  Filled 2024-05-16: qty 1

## 2024-05-16 NOTE — ED Notes (Signed)
 Patient provided meal tray.

## 2024-05-16 NOTE — ED Notes (Signed)
 Patient ambulatory to restroom with steady gait with sitter. Patient states that he is feeling better.

## 2024-05-16 NOTE — Interdisciplinary (Signed)
 05/16/24 1044   Assessment   Assessment Type Initial;Face to Face   Referral Information   Consult Type Mental Health Assessment;Substance Use Disorder   Drug Abuse Screening Test (DAST-10)   Have you used drugs other than those required for medical reasons? Y   Do you use more than one drug at a time? N   Are you always able to stop using drugs when you want to? N   Have you had blackouts or flashbacks as a result of drug use? Y   Do you ever feel bad or guilty about your drug use? Y   Does your spouse (or parents) ever complain about your involvement with drugs? Y   Have you ever neglected your family or missed work because of your use of drugs? N   Have you engaged in illegal activities in order to obtain drugs? N   Have you ever experienced withdrawal symptoms as a result of heavy drug intake? Y   Have you had medical problems as a result of your drug use (e.g., memory loss, hepatitis, convulsions, bleeding, etc.)? Y   DAST-10 Score (!) 7   Audit-C Questionnaire    Do you drink alcohol ?  yes   How often do you have a drink containing alcohol ? 4   How many standard drinks containing alcohol  do you have on a typical day? 3   How often do you have six or more drinks on one occasion? 4   Audit-C Score 11   Substance Abuse History Patient has never been to a substance abuse treatment program.

## 2024-05-16 NOTE — Interdisciplinary (Signed)
 Patient will be going to [facility]. Spoke to [employer] the Press photographer at [employer] and once the COVID test comes back they can accept the patient for admission.

## 2024-05-16 NOTE — Interdisciplinary (Signed)
 Pt [facility] via ambulance on a 5150 dts at around 1700.  Pt pleasant and cooperative, with interview.  Pt endorsed alcoholism and wanting help for that.  Pt took prn atarax and prn ativan to target anxiety=6.5 and continued itching on his arms and legs stating that this symptom improved slightly after taking a shower that he initiated this evening.  Pt pleasant, polite.  Pt does have detox sxs including tremors, anxiety, +TH. Will continue to monitor q 10 minutes for safety.

## 2024-05-16 NOTE — Plan of Care (Signed)
 Problem: Promotion of Mental Health and Safety  Goal: The pt remains safe, receives appropriate treatment and achieves outcomes (psychologically, psychosocially, physically, and spiritually) within the limitations of the disease process by discharge.  Outcome: Progressing  Flowsheets  Taken 05/16/2024 2221  Guidelines:   Inpatient Nursing Guidelines   Addiction, Chemical Dependence and Withdrawal   Mood disorder  Individualized Interventions/Recommendations #1: Promote participation in unit activities  Individualized Interventions/Recommendations #2: Support sleep hygiene  Individualized Interventions/Recommendations #3: Encourage and educate patient in medication compliance  Taken 05/16/2024 2000  Patient /Family stated Goal: sleep 8 hrs  Note: Unable to fully assess pts thought process as pt is sleeping, chest rise and fall, unlabored breaths, pt remains on CIWA, no score as pt is asleep, no s/sx of self harm, harm towards others, agitation Pt had total of 10 hours of sleep

## 2024-05-16 NOTE — Interdisciplinary (Signed)
 11:50 patient's mother called this SW concerned about her son and this SW explained that the patient was stable and he would be going to an inpatient for further care. She stated that she wanted to drop by some clothing for him and that she knows he does not want to talk to her.

## 2024-05-16 NOTE — ED Notes (Signed)
 Report given to [facility].

## 2024-05-16 NOTE — Plan of Care (Signed)
 Psychiatry Multidisciplinary Treatment Plan         MR#: [id number]  Christopher [name], Christopher [name]  CSN: [id number]  DOB: 1991-01-02 M       Admission Date: 05/14/2024      Legal Status: 72 Hour Hold                PATIENT STRENGTHS:       IDENTIFIED PROBLEMS:   Active Hospital Problems    Diagnosis    *Bipolar disorder, unspecified (CMS-HCC) [F31.9]      Resolved Hospital Problems   No resolved problems to display.       ANTICIPATED DISCHARGE PLAN: Unknown, to be determined    Discharge Criteria:   - Patient will exhibit no physical agitation, combativeness, or aggressive behavior that would require PRN medications for 24 consecutive hours.    - Patient will exhibit no despondent, disconsolate, or severe melancholy behavior that would require PRN medications for 24 consecutive hours.    - Patient will experience no hallucinations, verbal or auditory, that trigger problem behaviors requiring medications for 24 consecutive hours.    - Patient will have no verbalizations of suicide ideations for 24 consecutive hours.    - Patient will have no panic attacks associated with loss of functions (e.g., unable to attend milieu activities) for 24 consecutive hours.    - Appropriate aftercare plans will be established based on the patient's level of care needs, cognitive strengths and weaknesses, geographical location, and financial criteria.    - Patient will establish a stable medication regimen; no problem behaviors requiring medications for 24 consecutive hours.      Patient-Stated Goal: To get better and recover    ELOS: 1-3 days           _________________________         ____/____/___      _________  Patient Signature                                      Date                    Time      _________________________  Printed Name      Patient refused to sign:   Yes       No      _________________________         ____/____/___      _________  Witness          Date                    Time

## 2024-05-16 NOTE — Interdisciplinary (Signed)
 05/16/24 1000   Social Worker Documentation   Legal Status 72 Hour Hold   Is Patient Minor? No   Guardian self   Presenting Problems Patient was drinking alcohol  and called mom he was going to jump off the [place] bridge and he was found in the street by police placed on a 5150 for DTS and brought to the hospital.   Current Living Situation Homeless  (Patient lives in his car or at his mom's house.)   Lexicographer Contacts  Family Members   Current Runner, broadcasting/film/video   Education Some college   Language  English   Ethnicity  African American   Patient Strengths  Supportive/involved family/friends   Substance Abuse   Substance Used Alcohol    Amount unknown   Last Use 2 days ago   Discharge Planning   Outpatient Follow-up Care No   Patient Expects Discharge to: inpatient unit   Advance Directive -FILES TO AVS   Do you have an appointed Runner, broadcasting/film/video No   Medical Advance Directive Status  None   Reason You were too acutely ill   Psychiatric Advance Directive Status  None. This document is not recognized in the state of [place]      [facility]  ED SOCIAL WORK CONSULT NOTE:      REASON FOR CONSULT: Patient's mother and neighbors called 911 as patient stated that he was suicidal with a plan to jump off the [employer] bridge and the police found him in the middle of the street after he told his mother he was going  to jump into traffic and the police put him on a 5150 for DTS and brought him to the hospital.  LEGAL STATUS: 5150 DTS 05/14/24 18:10  LOCATION: ED Bed 13  PROVIDER: Emergency Medicine  ------------------------------------------------  HISTORY OF PRESENT ILLNESS: Pt is a 33 year old Single, African American, male with PMH Mood disorder and alcohol  use disorder who presents to the ED via police on a 5150 for DTS.    PRESENTING PROBLEM: Patient is a 33y/o Single, African American, male who told his mother's neighbors he was going to jump off the [place]  bridge and told his mother he was going to jump into traffic. Police were called and found him in the middle of the street, placed him on a 5150 for DTS and brought to the hospital. Patient is has very flat affect and is distraught. He denies SI, HI, AH and VH. However, he does report s/s of a mood disturbance to include: decreased sleep, appetite, an increase in sadness.    Substance Use History:  Patient uses alcohol  daily and last used 10/10 and doesn't remember what all he drank. He denies any other drug use. He has never had any substance abuse treatment in the past.    Social/Family History:  Education: Patient has some college    COLLATERAL: Out patient mental health providers: None  Inpatient psychiatric admissions: Patient has several in the past and his last one was [facility].  Patient has been calling [facility] and will continue to try to call them.  Suicide attempts: several  Medical problems: A-Fib and diabetes  Medications: suppose to be on medications but not clear what.      DIAGNOSIS: Unspecified mood disturbance                        Alcohol  use disorder by history    RESOURCES: Patient was seen by psychiatry and  this SW is looking for an outside bedsearch.    DISCHARGE PLAN: Patient was seen by psychiatry and and outside bed search it being done.  --------------------------------------------------------------------------------------------------     MENTAL STATUS EXAMINATION:  Appearance: /Unkempt/Disheveled Malodorous  Build: Obese  Behavior: Cooperative/ Suspicious/Nervous  Speech: WNL  Eye Contact: /Intermittent  Mood: Depressed/Anxious/  Affect: Flat  Hallucinations: None  Other:   Delusions: Babetta  Thought Process: Logical/Goal Oriented/Concrete  Self Abuse: Self Harm (denies SI)/SI (denies SI)/None  Aggression: HI (Denies HI)/None  Insight:  Poor  Judgment: Poor  Intelligence Estimate: average  Orientation: A&Ox4    RISK ASSESSMENT:  Risk to Self: intermediate  Risk to Others: low  Abuse  or Family Violence:  / low  Psychotic or Severely Psychologically Disabled:   no  Is there a handgun in the home: / no  Any other weapons:  / no  Able to safety plan:  / no  --------------------------------------------------------------------------------------------------    CONTACT:   Mother Dedra [phone]  ED Social Work - [employer]  [phone]

## 2024-05-16 NOTE — Interdisciplinary (Signed)
 Spoke to [place] at [facility] and they have no beds available but they have the patient information and he is on their wait list.  Spoke to [place] at [facility] and they have no beds available but they do have the patient information.  Spoke to [place] at [facility] and they have no beds available but they do have patient information and they are on his wait list.  Spoke to [place] at [facility] and they are checking to see if they have the patient information.  Spoke to [place] at [facility] and they have no beds available.

## 2024-05-16 NOTE — Consults (Signed)
 Psychiatry ED Progress note     Attending physician: Kirk Ozell Dade, MD  Reason for initial consult: Agitation    ID: Christopher [name] is a 32 year old male with history of bipolar disorder, alcohol  use disorder, atrial fibrillation, diabetes mellitus admitted for alcohol  intoxication and agitation.       Interval Update: The patient states that he is feeling better but very itchy. He is very frustrated that his mother has a lot of opinion about him but does not care enough to come and visit him in the hospital. He feels depressed and unmotivated realizes that he needs to change and needs to get treatment for his mental health. He is also aware that he needs to do something about his alcohol  consumption as it has led to recurrent hospitalizations. The patient would also be open to a rehab stay. He denies suicidal or homicidal ideations and denies auditory or visual hallucinations.    Behavioral Meds received: Phenobarbital , hydroxyzine, lorazepam,     Updates to Past Psych, Family Hx, Social Hx: none    Medical ROS: Review of Systems - itchiness      Exam   Vitals:   Vitals:    05/16/24 0058 05/16/24 0320 05/16/24 0715 05/16/24 0900   BP: 146/79 133/66  (!) 110/48   BP Location: Right arm Right arm     BP Patient Position: Semi-Fowlers Semi-Fowlers     Pulse: 56 52  68   Resp: 16 16     Temp:       SpO2: 96% 98% 98% 99%   Weight:           Mental Status Examination:   Appearance: Disheveled, wearing   Behavior: Calm cooperative   Motor/Abnormal Involuntary Movements: No signs of abnormal involuntary movement   Gait: not assessed   Speech: clear with normal rate and volume     Mood: Better   Affect: Dysthymic   Thought Process: coherent, logical  Associations: linear and goal-directed   Thought Content: No signs of delusional thinking, denies suicidal or homicidal ideations   Perceptions: no abnl  Insight/Judgment: Chiropractor, Orientation & Intellectual Functions: Alert, oriented x3    Labs:    No labs today.      Medical Decision Making      Assessment: Christopher [name] is a 33 year old male with history of alcohol  use disorder admitted for alcohol  intoxication and agitation. The patient appears to be improving. He is showing symptoms of depression in the context of underlying bipolar disorder. The patient continues to meet criteria for psychiatric inpatient admission and would be willing to go to unit. At the same time he could also benefit from a rehab facility the will treat dual diagnoses.    A comprehensive suicide risk assessment was performed and the patient was assessed to be at a low acute risk of self-harm due to the following factors:  - Suicidal ideation: Denies    - Plan: Denies  - Intent: Denies  - Suicidal/Self Harm Behaviors: Denies  - Modifiable risk factors: precipitating stressors and severe medical illness  - Non-modifiable risk factors: existing psychiatric diagnoses, male gender, and single status  - Protective factors: future life plans, coping skills, and access to health care    Diagnosis:   Bipolar disorder  Alcohol  used disorder    Recommendations:  - Disposition plan: Patient meets criteria for psychiatric hold and psychiatric inpatient admission. SW to continue outpatient bed search.  -  Scheduled medications: None  - PRN medications (for agitation/anxiety): None  - Medical clearance concerns: Her ED team  - Legal status: 5150 DTS, exp. 05/17/2024 at 1810  - Level of observation: level 2  - Outpatient provider not contacted        Thank you for this consult. Please page psychiatry if additional questions. If patient is in the [facility] emergency department, page [phone] to reach the psychiatry ED resident. If the patient is in the [facility], please consult the paging website for the provider on call.

## 2024-05-16 NOTE — H&P (Signed)
 Psychiatry History and Physical    Chief Complaint: Agitation  Legal Status: 72 hour hold      Hold Expiration: 10/13 @ 1810     History of Present Illness:     Per CL note from [facility] ED, Dr. Clemetine:    Christopher [name] is a 33 year old male with history of bipolar disorder, alcohol  use disorder, atrial fibrillation, diabetes mellitus admitted for alcohol  intoxication and agitation.     The patient states that he can not remember what happened yesterday.  He knows that he was drinking and eventually got picked up by police being agitated. He drinks regularly hard liquor. He knows that he has an alcohol  problem but he does not see a purpose in going to a rehab facility. Is in the process of trying to find a job and has been applying to remote jobs at [employer] and other institution but has not been successful so far. Things that by getting a job he would be able to stop drinking. He has been homeless but is currently living in an apartment that belongs to his mother. He denies suicidal or homicidal ideations and denies auditory or visual hallucinations.     Collateral information (mother Dedra, [phone]): The patient has been very ill for many months. He has been erratic and paranoid. He has been endorsing that he is part of a gang, which is not true. He has been acting differently and threatening to family members. He is driving recklessly and went to his grandmother, threatening her also. He has been having many episodes of suicidal threats. She is very scared that he will hurt someone. He acts grandiose and has no sense of reality. She knows that something really bad will happen.    Per interview with pt upon transfer to [facility]:    Christopher [name] was calm and cooperative during the interview, expressing remorse for what happened and hoping to get help going forward saying I can afford to have something like that happen again. He says that he feels well otherwise, maybe a little depressed but otherwise  is goal/future oriented and fully engaged with seeking treatment. He denies any SI/HI/AVH.    Past Psychiatric History:  - Diagnoses: Alcohol  Use Disorder, reported Bipolar  - Hospitalizations: No inpatient psych hospitalizations  - Suicide attempts: Denies  - Outpatient therapy: Says in the past, but not certain  - Medication trials: Buspirone, Xanax, Valium , Librium, Naltrexone, Ativan, gabapentin  - Psychological Trauma History (including sexual, emotional or physical abuse):  No history of psychological trauma.  - Current medications: Librium/Naltrexone    Substance Use / Treatment History:     Alcohol , reports being in a 100 day program before as well as numerous medication trials to help quit drinking.  Numerous ED visits for Alcohol  withdrawal      Allergies:  Allergies[1]    Past Medical and Surgical History:  Afib-SVTs, possibly Paroxysmal, prescribed Apixaban 5mg  in the past    -Medications:  Prescriptions Prior to Admission[2]  - Medication compliance: Intermittently compliant    Family History:  - Psychiatric family history: Reports schizophrenia in younger brother, depression in father    Social History:  Unhoused, sleeping in his mother's truck    Legal History:  Unknown    Review of Systems:  ROS    Pain assessment: The patient denies any pain.    Physical Exam:  BP (!) 152/96 (BP Location: Left arm, BP Patient Position: Sitting)   Pulse 70  Temp 98.3 F (36.8 C)   Resp 18   Wt 126.1 kg (278 lb)   SpO2 98%   BMI 33.84 kg/m   Estimated body mass index is 33.84 kg/m as calculated from the following:    Height as of 04/13/24: 6' 4 (1.93 m).    Weight as of this encounter: 126.1 kg (278 lb).     Physical Exam  Constitutional:       Appearance: Normal appearance.   HENT:      Head: Normocephalic and atraumatic.      Nose: Nose normal.      Mouth/Throat:      Mouth: Mucous membranes are moist.   Eyes:      Extraocular Movements: Extraocular movements intact.      Pupils: Pupils are equal, round,  and reactive to light.   Cardiovascular:      Rate and Rhythm: Normal rate.   Pulmonary:      Effort: Pulmonary effort is normal.      Breath sounds: Normal breath sounds.   Abdominal:      General: Abdomen is flat. Bowel sounds are normal.      Palpations: Abdomen is soft.   Musculoskeletal:      Cervical back: Normal range of motion and neck supple.   Skin:     General: Skin is warm and dry.   Neurological:      General: No focal deficit present.      Mental Status: He is alert and oriented to person, place, and time. Mental status is at baseline.           Mental Status Examination:    Appearance: Well groomed, age appropriate, wearing hospital scrubs  Behavior: Calm, cooperative, appropriate eye contact  Motor/Abnormal Involuntary Movements: None appreciated  Gait: Normal  Speech: Normal rate, rhythm, prosody   Mood: A little down  Affect: Congruent to mood, euthymic  Thought Process: coherent, logical  Associations: linear and goal-directed  Thought Content: Help seeking, future oriented, remorseful  Perceptions: no abnl  Insight/Judgment: Fair  Sensorium: Level of consciousness awake; attentive; recent memory intact; remote memory intact  Orientation: Patient is oriented to person, place, time, and situation  Intellectual Functions: Fund of knowledge average based on grammar and vocabulary; Interpretations abstract    Lab Results:  Lab Results   Component Value Date    AMPCLASS Negative 05/14/2024    BARBCLASS Negative 05/14/2024    BENZYLCGNN Negative 05/14/2024    BENZDIAZCL Positive 05/14/2024    OPIATESCL Negative 05/14/2024    TETCANNABIN Negative 05/14/2024          Lab Results   Component Value Date    ETOH 590 05/14/2024       Lab Results   Component Value Date    WBC 4.9 05/14/2024    RBC 4.95 05/14/2024    HGB 13.6 (L) 05/14/2024    HCT 41.6 05/14/2024    MCV 84.0 05/14/2024    MCHC 32.7 05/14/2024    RDW 13.3 05/14/2024    PLT 234 05/14/2024    MPV 10.2 05/14/2024       Lab Results   Component  Value Date    BUN 6 05/14/2024    CREAT 1.11 05/14/2024    CL 106 05/14/2024    NA 148 (H) 05/14/2024    K 3.4 (L) 05/14/2024    CA 8.5 05/14/2024    TBILI 0.2 05/14/2024    ALB 3.9 05/14/2024    TP 7.7 05/14/2024  AST 75 (H) 05/14/2024    ALK 81 05/14/2024    BICARB 28 05/14/2024    ALT 106 (H) 05/14/2024    GLU 233 (H) 05/14/2024            No results found for: PREALB    No results found for: TSH    No results found for: FREET4    Lab Results   Component Value Date    A1C 7.4 (H) 04/13/2024       No results found for: CHOL, HDL, LDLCALC, TRIG, LDLDIRECT    No results found for: VITD25HYDROX, VITAMIND25HY, VD2, VD3, VDT    EKG Interpretation: Normal Sinus Rhythm, Normal intervals, and block with Normal Axis and reported fascicular block.    Lab Results   Component Value Date    COV2PCRRRL Not Detected 05/16/2024         Narrative Assessment:    Christopher [name] is a 33 year old male with history of bipolar disorder, alcohol  use disorder, atrial fibrillation, diabetes mellitus admitted for alcohol  intoxication and agitation.     The etiology of his current presentation is best explained by an alcohol  induced mood disorder given his intoxication, the rapid resolution of his symptoms, and the clear remorse and regret he expresses during interview. Considered a primary thought disorder or mood disorder unspecified, however, these are much less likely given the predominant signs/symptoms of the historical medical history (numerous ED visits for this) and the elements of his interview.    As such, he would benefit from from further medication optimization while on inpatient, continuation of his 72 hour hold, and placement into a substance use program for Alcohol  use. He is self-motivated to get help, recognizes that I got lucky I didn't go to jail and is amenable to all treatment options for help.     A comprehensive suicide risk assessment was performed and the patient was assessed  to be at a low acute risk of self-harm due to the following factors:  - Suicidal ideation: Denies    - Plan: Denies  - Intent: Denies  - Suicidal/Self Harm Behaviors: Denies  - Modifiable risk factors: precipitating stressors and intoxication  - Non-modifiable risk factors: male gender and single status  - Protective factors: future life plans, coping skills, and access to health care    A violence risk assessment was also performed. The following behavioral risk factors are associated with acute violence risk and have been present within the past 24 hours: irritability and impulsivity. Based on these factors, this patient's acute risk of violence in the inpatient setting in the next 24 hours is assessed to be low. Historical (non-modifiable) risk factors for violence in this patient include: prior violent acts.    Clinical Global Impression - Severity: 2= Borderline mentally ill  :Suggested Guidelines= Subtle or suspected pathology  Prognosis - good/fair/poor: good    Active Problem List:  Alcohol  Use Disorder  Substance induced mood disorder    Plan:    A. Psychiatric:   #Alcohol  Use Disorder  #Substance induced mood d/o  -Day team consider restarting Naltrexone after conversation w/ pt  -CIWA protocol  -SW substance use clinic    Behavioral monitoring - every 10 minutes  B. Medical:     #HTN  -Restart home 20mg  Lisinopril qday, CTM BP    #Afib, SVT - ?Paroxysmal  -Previously on Apix 5 q day, unclear if taking it as pt can't remember all his medications  -Consider Cards consult for further  elucidation    #DM2  -Blood Glucose > 200 on two tests  -Metformin 500mg  BID    Labs: No further labs  Diet: Regular  C. Psychosocial: Per SW   D. Legal: 5150 expires on 10/13 at 1810     E. Disposition: Other: Substance use program    Days to Complete Medical Plan of Care Requiring Ongoing Hospitalization: Anticipated Tomorrow --->Care Needs: Placement for Alcohol  Use Program                 (Discharge Planning and EDD Info)      Decision Making Capacity:  This patient is capable of making his or her own healthcare decisions during this hospitalization.      Code Status:  Orders Placed This Encounter      Full Code - Call Code      Outcome of Discussion of Plan and Alternatives With Patient/Surrogate:    Patient agrees with the plan above: yes    Primary Care Physician:  [facility]    Note Author: Al Charleston Tammy Wickliffe, 05/16/24, 5:25 PM         [1] No Known Allergies  [2]   No medications prior to admission.

## 2024-05-16 NOTE — ED Notes (Signed)
 Only 130mg  of Phenobarb administered at 0550 due to RN misunderstanding order, additional 130mg  administered at [phone]

## 2024-05-17 DIAGNOSIS — F39 Unspecified mood [affective] disorder: Secondary | ICD-10-CM

## 2024-05-17 DIAGNOSIS — F1020 Alcohol dependence, uncomplicated: Secondary | ICD-10-CM

## 2024-05-17 LAB — FOLIC ACID, BLOOD: Folate: 11.5 ng/mL

## 2024-05-17 MED ORDER — HYDROXYZINE HCL 25 MG OR TABS
50.0000 mg | ORAL_TABLET | ORAL | Status: DC | PRN
Start: 2024-05-17 — End: 2024-05-21
  Administered 2024-05-18: 50 mg via ORAL
  Filled 2024-05-17: qty 2

## 2024-05-17 MED ORDER — OLANZAPINE ODT 10 MG OR TBDP
10.0000 mg | ORAL_TABLET | ORAL | Status: DC | PRN
Start: 2024-05-17 — End: 2024-05-22

## 2024-05-17 MED ORDER — LORAZEPAM 1 MG OR TABS
1.0000 mg | ORAL_TABLET | ORAL | Status: DC | PRN
Start: 2024-05-17 — End: 2024-05-21

## 2024-05-17 MED ORDER — LORAZEPAM 2 MG/ML OR CONC
2.0000 mg | ORAL | Status: DC | PRN
Start: 2024-05-17 — End: 2024-05-17

## 2024-05-17 MED ORDER — NALTREXONE HCL 50 MG OR TABS
50.0000 mg | ORAL_TABLET | Freq: Every day | ORAL | Status: DC
Start: 2024-05-17 — End: 2024-05-22
  Administered 2024-05-17 – 2024-05-22 (×6): 50 mg via ORAL
  Filled 2024-05-17 (×6): qty 1

## 2024-05-17 MED ORDER — LORAZEPAM 2 MG OR TABS
2.0000 mg | ORAL_TABLET | ORAL | Status: DC | PRN
Start: 2024-05-17 — End: 2024-05-17

## 2024-05-17 MED ORDER — TRAZODONE HCL 50 MG OR TABS
50.0000 mg | ORAL_TABLET | Freq: Every evening | ORAL | Status: DC
Start: 2024-05-17 — End: 2024-05-22
  Administered 2024-05-17 – 2024-05-21 (×5): 50 mg via ORAL
  Filled 2024-05-17 (×5): qty 1

## 2024-05-17 MED ORDER — DIAZEPAM 10 MG OR TABS
10.0000 mg | ORAL_TABLET | Freq: Four times a day (QID) | ORAL | Status: DC | PRN
Start: 2024-05-17 — End: 2024-05-21
  Administered 2024-05-17 – 2024-05-20 (×6): 10 mg via ORAL
  Filled 2024-05-17 (×7): qty 1

## 2024-05-17 NOTE — Interdisciplinary (Signed)
 Psychiatry Multidisciplinary Treatment Plan         MR#: [id number]  Christopher [name], Christopher [name]  CSN: [id number]  DOB: 10/16/1990 M       Admission Date: 05/14/2024      Legal Status: 72 Hour Hold                PATIENT STRENGTHS:  Patient Strengths: Supportive/involved family/friends;History of indepedent functionality resources;Accepts need for medication/therapeutic interventions;Intact cognition;Has insight into illness;Average intelligence    IDENTIFIED PROBLEMS:   Active Hospital Problems    Diagnosis    *Bipolar disorder, unspecified (CMS-HCC) [F31.9]      Resolved Hospital Problems   No resolved problems to display.       ANTICIPATED DISCHARGE PLAN: Unknown, to be determined    Discharge Criteria: - Patient will exhibit no physical agitation, combativeness, or aggressive behavior that would require PRN medications for 24 consecutive hours.    - Patient will exhibit no despondent, disconsolate, or severe melancholy behavior that would require PRN medications for 24 consecutive hours.    - Patient will experience no hallucinations, verbal or auditory, that trigger problem behaviors requiring medications for 24 consecutive hours.    - Patient will have no verbalizations of suicide ideations for 24 consecutive hours.    - Patient will have no panic attacks associated with loss of functions (e.g., unable to attend milieu activities) for 24 consecutive hours.    - Appropriate aftercare plans will be established based on the patient's level of care needs, cognitive strengths and weaknesses, geographical location, and financial criteria.    - Patient will establish a stable medication regimen; no problem behaviors requiring medications for 24 consecutive hours.      Patient-Stated Goal: treat substance use disorder and depression    ELOS: 1 week           _________________________         ____/____/___      _________  Patient Signature                                      Date                     Time      _________________________  Printed Name      Patient refused to sign:   Yes       No      _________________________         ____/____/___      _________  Witness          Date                    Time

## 2024-05-17 NOTE — Plan of Care (Signed)
 Problem: Promotion of Mental Health and Safety  Goal: The pt remains safe, receives appropriate treatment and achieves outcomes (psychologically, psychosocially, physically, and spiritually) within the limitations of the disease process by discharge.  Outcome: Progressing  Flowsheets  Taken 05/17/2024 2000 by Toula Miyasaki, RN  Patient /Family stated Goal: sleep 8 hrs  Taken 05/17/2024 1059 by Blair Lenis, RN  Individualized Interventions/Recommendations #1: pt encouraged to take meds if asked to, attend groups, have good self disclosure of thougths and feelings  Taken 05/16/2024 2221 by William Laske, RN  Guidelines:   Inpatient Nursing Guidelines   Addiction, Chemical Dependence and Withdrawal   Mood disorder  Individualized Interventions/Recommendations #2: Support sleep hygiene  Individualized Interventions/Recommendations #3: Encourage and educate patient in medication compliance  Note: Pt denies SI/HI/AVH, pt polite and cooperative upon approach, visible in the milieu at times, reports detox symptoms of tactile hallucinations, anxiety and nausea, received PRN valium  for CIWA of 9, pt medication compliant, no s/sx of self harm, harm towards others, agitation. Pt slept 8.5 hrs, chest rise and fall, unlabored breaths

## 2024-05-17 NOTE — Progress Notes (Signed)
 PSYCHIATRY PROGRESS NOTE    ID: Christopher [name] is a 33 year old male with history of bipolar disorder, alcohol  use disorder, atrial fibrillation, diabetes mellitus admitted for alcohol  intoxication and agitation and reports of suicide attempt trying to run into traffic. Admitted on 5150 for DTS, expired 10/13 at 18:10. Pt signed voluntary on 10/13.      Target Symptoms: low mood, alcohol  cravings,    Interval Events:  Sleep: slept 8.5 hours  NAEON  Per nursing, Pt denies SI/HI/AVH, pt polite and cooperative upon approach, visible in the milieu at times, reports detox symptoms of tactile hallucinations, anxiety and nausea, received PRN valium  for CIWA of 9, pt medication compliant, no s/sx of self harm, harm towards others, agitation. Pt slept 8.5 hrs, chest rise and fall, unlabored breaths      Subjective:   This morning, patient was asleep in bed, but was arousable when care team introduced themselves. He was calm, cooperative for all questioning. Says his biggest concern is his drinking, and states that he wants help to quit. He is motivated to drink when he feels strong cravings, when his mood is low and when he feels he needs help falling asleep. He has been hospitalized before with alcohol  withdrawal symptoms in the past many times. He described previous attempts to quit, with the most successful being a 100-day stretch where he stayed with family and was taking prescribed gabapentin (likely for diabetic neuropathy), buspirone and chlordiazepoxide. Says he is willing to stay inpatient, and willing to take medication to help with his cravings.     He says he thinks he has been diagnosed with bipolar disorder in the past, although he only describes nonspecific mania symptoms which last occurred about a year and a half ago. He also has never been prescribed a mood stabilizing medication to his knowledge. Denies lack of need for sleep or any previous risky behavior or sense of euphoria. He has never been  on an antidepressant medication.    He currently denies any SI/HI or AVH. He does not remember what his intentions were when he began walking into traffic at [facility], but remembers feeling incredibly depressed. He says it has been very hard to manage his alcohol  use while being unemployed and unhoused.     In general, he has had a hard time sleeping since being admitted to the hospital. He had an easier time falling asleep last night, but woke up multiple times due to vivid nightmares. Denies nightmares at home but struggles with sleep at home and often uses alcohol  to sleep. Says that he is sensitive to noises here on the unit. Reports episodes of paranoia while hospitalized for alcohol  withdrawal in the past, thought people with guns were going to get his family, reports occasional auditory hallucinations but more extensive visual hallucinations. His hallucinations only occur while in the hospital. Denied any AVH while outside the hospital.    On further interview, patient reports that he's feeling not very good, today.  Reports that he had a difficult night with people coming in to wake him up, reports he might have seen things that weren't there, reports having nightmares.  Denies SI/HI.  Reports he wants help to quit drinking alcohol , reports feeling very depressed at this time, finds it difficult to socialize or do things.    Family, Psychiatric, or Social History Updates:   None    Scheduled Medications:   lisinopril  20 mg Daily    metFORMIN  500 mg BID    naltrexone  50 mg Daily    sertraline  50 mg Daily    thiamine   100 mg Daily    traZODone  50 mg HS       PRN Medications:   acetaminophen   650 mg Q4H PRN      aluminum-magnesium-simethicone  30 mL Q6H PRN      diazePAM   10 mg Q6H PRN 10 mg at 05/17/24 2008    hydrOXYzine HCL  50 mg Q4H PRN      loperamide  2 mg Q6H PRN      LORazepam  1 mg Q4H PRN      magnesium hydroxide  30 mL Nightly PRN      OLANZapine  10 mg Q4H PRN         Review of  Systems:   Denies headache, nausea, vomiting, hematuria, dysuria, vision changes, tremor or difficulty ambulating.   Endorses abdominal pain, and described a significant urge to use the bathroom.     Objective:   05/17/24  1212 05/17/24  2000 05/17/24  2055 05/18/24  0800   BP: (!) 137/94 (!) 141/98 (!) 140/93 158/87   Pulse: 82 60 58 62   Temp:  97.6 F (36.4 C)  98.2 F (36.8 C)   Resp: 18 18  18    SpO2: 100% 97%  99%     Pain: Pain Score: 0  Night Shift Hours of Sleep: 8.5 hrs.  Administered PRN meds in the last 24 hours (or last weekend):   Dose: Lorazepam 1 mg  Time: 10/12 1819, 10/13 1249  Dose: Diazepam  10 mg  Time: 10/13 2008      Mental Status Examination:    Appearance: Appears stated age, lying in bed under sheets in hospital gown. Fair grooming and hygiene   Behavior: Calm, cooperative, minimal eye contact.  Motor / abnormal involuntary movements: No psychomotor agitation or retardation. No tics. No tremors noted during interview   Gait: Steady  Speech: Normal rate, volume and rhythm.  Mood: I'm doing better  Affect: Congruent, dysphoric  Thought process: coherent, logical  Associations: linear and goal-directed  Thought content: Help seeking, remorseful, no current SI or HI.   Perceptions: no abnl. Denies AVH  Insight / judgment: Fair/fair, understands need for treatment and willing to accept treatment, despite history of repeated hospital for substance use   Sensorium / orientation / intellectual functions: AOx4    New Labs/Studies:   None    Narrative Assessment:   Christopher [name] is a 33 year old male with history of bipolar vs unipolar disorder, alcohol  use disorder, atrial fibrillation, diabetes mellitus admitted for alcohol  intoxication and agitation and reports of suicide attempt trying to run into traffic.    Patient's withdrawal symptoms continuing to improve, although have not yet resolved. He continues to express remorse for his prior drinking pattern, and demonstrates desire to  engage with alcohol  use programs. On interview, it seems his baseline alcohol  use is influenced by cravings, low mood and lack of sleep. Suspect his substance use is contributing to his low mood and thus will start an SSRI, sertraline, as he reports never taking one previous and will monitor for any manic symptoms given the possible report bipolar history. Plan to trial naltrexone for cravings and trazodone for sleep. He will likely also benefit from discharge to a substance use program once he is medically optimized. IP hospitalization warranted for ongoing medication mgmt, treatment planning, and safe dispo  planning.     A comprehensive suicide risk assessment was performed and the patient was assessed to be at a intermediate acute risk of self-harm due to the following factors:  - Suicidal ideation: Denies    - Plan: Denies  - Intent: Denies  - Suicidal/Self Harm Behaviors: Patient could not or would not answer  - Modifiable risk factors: precipitating stressors and intoxication  - Non-modifiable risk factors: previous suicide attempts, male gender, and single status  - Protective factors: coping skills and therapeutic relationships    A violence risk assessment was also performed. The following behavioral risk factors are associated with acute violence risk and have been present within the past 24 hours: irritability. Based on these factors, this patient's acute risk of violence in the inpatient setting in the next 24 hours is assessed to be low. Historical (non-modifiable) risk factors for violence in this patient include: prior violent acts.    Active Problem List:  - Mood Disorder, Unspecified   - Alcohol  Use Disorder   - r/o MDD vs substance induced mood disorder     Plan:  A. Psychiatric:   #Mood disorder, unspecified  - Sertraline 50 mg q24h    # Alcohol  Use Disorder  # Substance induced mood d/o  - Naltrexone 50 mg q24h  - thiamine  100mg  q24h  - SW substance use clinic  - Behavioral monitoring q20m    #  Insomnia   - Trazodone 50 mg qPM    B. Medical:   # Alcohol  Withdrawal  # Diarrhea   - CIWA protocol  - diazepam  10mg  q6h PRN  - loperamide 2mg  q6h PRN    #HTN  - Restart home 20mg  Lisinopril q24h  - CTM BP - SBP continues to be mildly elevated in the 150s    #DM2  - Blood Glucose > 200 on two tests  - Metformin 500mg  BID     #Afib, SVT - ?Paroxysmal  -Previously on Apix 5 q day, unclear if taking it as pt can't remember all his medications  - EKG was normal  -Consider Cards consult for further elucidation     Audit-C Score: 11  4 or more is considered unhealthy alcohol  use in men.    The Centers for Disease Control and Prevention and [employer] on Aging are used as references to provide feedback to the patient regarding his/her alcohol  use in comparison to national norms.    http://www.cdc.gov/alcohol /fact-sheets/alcohol -use.htm   https://www.nia.nih.gov/health/publication/alcohol -use-older-people    Patient engaged in a brief intervention for alcohol  use. The patient engaged in a brief intervention that included the following components:    Feedback concerning the quantity and frequency of alcohol  consumed in comparison to national norms,  Discussion of the possible negative physical, emotional, social and occupational consequences of significant alcohol  use,  Discussion of the overall severity of patient's alcohol  use.    Based on this discussion patient is deemed to be in the contemplative stage of change regarding their alcohol  use.    Patient was engaged in a joint decision making process regarding their alcohol  use and plans for follow-up. Patient accepted referral for the following referrals: residential rehabilitation programs and sober living facilities.  C.  Psychosocial: per SW note.  D.  Legal: vol, previously on 5150 for DTS starting on 10/10  E.  Disposition: to be determined. likely substance use program    Days to Complete Medical Plan of Care Requiring Ongoing Hospitalization: 2 days out  --->Care Needs: Placement to Acohol Use Program.                  (  Discharge Planning and EDD Info)    This patient and his care were discussed with the attending psychiatrist, Dr. Ed.    Written with the help of Serene Childs, MS3    Hoy Seltzer, MD  [facility], PGY1    Attending Psychiatrist Addendum 05/18/24    I have seen and examined the patient and reviewed the history, presentation, progress, and diagnosis, with the resident. Dr. Seltzer was physically present with the medical student during the performance of the history, examination and medical decision-making. I agree with the findings and plan as documented with additional comments made below, and I participated in the medical decision making with the resident physician. My edits are made in underlined/italicized text.     The patient is appropriate for continued psychiatric hospitalization for safety, diagnostic clarification, medication management, stabilization, and safe dispo planning.    Harlene Macario Ed, MD  Psychiatry Attending

## 2024-05-17 NOTE — Interdisciplinary (Incomplete)
 ROI for mom but revoked today   [place] [phone]-

## 2024-05-17 NOTE — Utilization Review (Signed)
 ============Authorization Summary====================  -Primary Insurance/Health Plan Name:Payor: MEDI-CAL / Plan: [place] [employer] [facility] / Product Type: Medicaid Managed Care /   -BH is authorized by [employer] and HB claim should go to Medi-Cal.  Do not send claim to [facility]  -Authorization/Reference/Tracking Number: pending per [employer]  -Secondary Insurance: None  - Physician admit order: INPATIENT 05/16/2024 @ 1632  -Utilization review: Currie NOVAK., RN PH: [phone] FAX: [phone]  =============Running Summary============================  05/16/2024/Inpatient/Acute -  -------------------------------------------------------------------------------------------------------     Psychiatry History and Physical     Chief Complaint: Agitation  Legal Status: 72 hour hold      Hold Expiration: 10/13 @ 1810      History of Present Illness:     Per CL note from [facility] ED, Dr. Clemetine:     Christopher [name] is a 33 year old male with history of bipolar disorder, alcohol  use disorder, atrial fibrillation, diabetes mellitus admitted for alcohol  intoxication and agitation.     The patient states that he can not remember what happened yesterday.  He knows that he was drinking and eventually got picked up by police being agitated. He drinks regularly hard liquor. He knows that he has an alcohol  problem but he does not see a purpose in going to a rehab facility. Is in the process of trying to find a job and has been applying to remote jobs at [employer] and other institution but has not been successful so far. Things that by getting a job he would be able to stop drinking. He has been homeless but is currently living in an apartment that belongs to his mother. He denies suicidal or homicidal ideations and denies auditory or visual hallucinations.     Collateral information (mother Christopher [name], [phone]): The patient has been very ill for many months. He has been erratic and paranoid. He has been endorsing that he is part  of a gang, which is not true. He has been acting differently and threatening to family members. He is driving recklessly and went to his grandmother, threatening her also. He has been having many episodes of suicidal threats. She is very scared that he will hurt someone. He acts grandiose and has no sense of reality. She knows that something really bad will happen.     Per interview with pt upon transfer to [facility]:     Christopher [name] was calm and cooperative during the interview, expressing remorse for what happened and hoping to get help going forward saying I can afford to have something like that happen again. He says that he feels well otherwise, maybe a little depressed but otherwise is goal/future oriented and fully engaged with seeking treatment. He denies any SI/HI/AVH.     Past Psychiatric History:  - Diagnoses: Alcohol  Use Disorder, reported Bipolar  - Hospitalizations: No inpatient psych hospitalizations  - Suicide attempts: Denies  - Outpatient therapy: Says in the past, but not certain  - Medication trials: Buspirone, Xanax, Valium , Librium, Naltrexone, Ativan, gabapentin  - Psychological Trauma History (including sexual, emotional or physical abuse):  No history of psychological trauma.  - Current medications: Librium/Naltrexone     Substance Use / Treatment History:  Alcohol , reports being in a 100 day program before as well as numerous medication trials to help quit drinking.  Numerous ED visits for Alcohol  withdrawal        Allergies:  [Allergies]    [Allergies]  No Known Allergies     Past Medical and Surgical History:  Afib-SVTs, possibly Paroxysmal,  prescribed Apixaban 5mg  in the past     -Medications:  [Prescriptions Prior to Admission]    [Prescriptions Prior to Admission]  No medications prior to admission.     - Medication compliance: Intermittently compliant     Family History:  - Psychiatric family history: Reports schizophrenia in younger brother, depression in father     Social  History:  Unhoused, sleeping in his mother's truck     Legal History:  Unknown     Review of Systems:  ROS     Pain assessment: The patient denies any pain.     Physical Exam:  BP (!) 152/96 (BP Location: Left arm, BP Patient Position: Sitting)   Pulse 70   Temp 98.3 F (36.8 C)   Resp 18   Wt 126.1 kg (278 lb)   SpO2 98%   BMI 33.84 kg/m         Estimated body mass index is 33.84 kg/m as calculated from the following:    Height as of 04/13/24: 6' 4 (1.93 m).    Weight as of this encounter: 126.1 kg (278 lb).      Physical Exam  Constitutional:       Appearance: Normal appearance.   HENT:      Head: Normocephalic and atraumatic.      Nose: Nose normal.      Mouth/Throat:      Mouth: Mucous membranes are moist.   Eyes:      Extraocular Movements: Extraocular movements intact.      Pupils: Pupils are equal, round, and reactive to light.   Cardiovascular:      Rate and Rhythm: Normal rate.   Pulmonary:      Effort: Pulmonary effort is normal.      Breath sounds: Normal breath sounds.   Abdominal:      General: Abdomen is flat. Bowel sounds are normal.      Palpations: Abdomen is soft.   Musculoskeletal:      Cervical back: Normal range of motion and neck supple.   Skin:     General: Skin is warm and dry.   Neurological:      General: No focal deficit present.      Mental Status: He is alert and oriented to person, place, and time. Mental status is at baseline.            Mental Status Examination:    Appearance: Well groomed, age appropriate, wearing hospital scrubs  Behavior: Calm, cooperative, appropriate eye contact  Motor/Abnormal Involuntary Movements: None appreciated  Gait: Normal  Speech: Normal rate, rhythm, prosody   Mood: A little down  Affect: Congruent to mood, euthymic  Thought Process: coherent, logical  Associations: linear and goal-directed  Thought Content: Help seeking, future oriented, remorseful  Perceptions: no abnl  Insight/Judgment: Fair  Sensorium: Level of consciousness awake;  attentive; recent memory intact; remote memory intact  Orientation: Patient is oriented to person, place, time, and situation  Intellectual Functions: Fund of knowledge average based on grammar and vocabulary; Interpretations abstract     Lab Results:        Lab Results   Component Value Date     AMPCLASS Negative 05/14/2024     BARBCLASS Negative 05/14/2024     BENZYLCGNN Negative 05/14/2024     BENZDIAZCL Positive 05/14/2024     OPIATESCL Negative 05/14/2024     TETCANNABIN Negative 05/14/2024               Lab Results  Component Value Date     ETOH 590 05/14/2024               Lab Results   Component Value Date     WBC 4.9 05/14/2024     RBC 4.95 05/14/2024     HGB 13.6 (L) 05/14/2024     HCT 41.6 05/14/2024     MCV 84.0 05/14/2024     MCHC 32.7 05/14/2024     RDW 13.3 05/14/2024     PLT 234 05/14/2024     MPV 10.2 05/14/2024               Lab Results   Component Value Date     BUN 6 05/14/2024     CREAT 1.11 05/14/2024     CL 106 05/14/2024     NA 148 (H) 05/14/2024     K 3.4 (L) 05/14/2024     CA 8.5 05/14/2024     TBILI 0.2 05/14/2024     ALB 3.9 05/14/2024     TP 7.7 05/14/2024     AST 75 (H) 05/14/2024     ALK 81 05/14/2024     BICARB 28 05/14/2024     ALT 106 (H) 05/14/2024     GLU 233 (H) 05/14/2024            No results found for: PREALB     No results found for: TSH     No results found for: FREET4           Lab Results   Component Value Date     A1C 7.4 (H) 04/13/2024         No results found for: CHOL, HDL, LDLCALC, TRIG, LDLDIRECT     No results found for: VITD25HYDROX, VITAMIND25HY, VD2, VD3, VDT     EKG Interpretation: Normal Sinus Rhythm, Normal intervals, and block with Normal Axis and reported fascicular block.           Lab Results   Component Value Date     COV2PCRRRL Not Detected 05/16/2024            Narrative Assessment:    Christopher [name] is a 33 year old male with history of bipolar disorder, alcohol  use disorder, atrial fibrillation, diabetes mellitus  admitted for alcohol  intoxication and agitation.      The etiology of his current presentation is best explained by an alcohol  induced mood disorder given his intoxication, the rapid resolution of his symptoms, and the clear remorse and regret he expresses during interview. Considered a primary thought disorder or mood disorder unspecified, however, these are much less likely given the predominant signs/symptoms of the historical medical history (numerous ED visits for this) and the elements of his interview.     As such, he would benefit from from further medication optimization while on inpatient, continuation of his 72 hour hold, and placement into a substance use program for Alcohol  use. He is self-motivated to get help, recognizes that I got lucky I didn't go to jail and is amenable to all treatment options for help.      A comprehensive suicide risk assessment was performed and the patient was assessed to be at a low acute risk of self-harm due to the following factors:  - Suicidal ideation: Denies    - Plan: Denies  - Intent: Denies  - Suicidal/Self Harm Behaviors: Denies  - Modifiable risk factors: precipitating stressors and intoxication  - Non-modifiable risk factors: male gender and single  status  - Protective factors: future life plans, coping skills, and access to health care     A violence risk assessment was also performed. The following behavioral risk factors are associated with acute violence risk and have been present within the past 24 hours: irritability and impulsivity. Based on these factors, this patient's acute risk of violence in the inpatient setting in the next 24 hours is assessed to be low. Historical (non-modifiable) risk factors for violence in this patient include: prior violent acts.     Clinical Global Impression - Severity: 2= Borderline mentally ill  :Suggested Guidelines= Subtle or suspected pathology  Prognosis - good/fair/poor: good     Active Problem List:  Alcohol  Use  Disorder  Substance induced mood disorder     Plan:    A. Psychiatric:   #Alcohol  Use Disorder  #Substance induced mood d/o  -Day team consider restarting Naltrexone after conversation w/ pt  -CIWA protocol  -SW substance use clinic     Behavioral monitoring - every 10 minutes  B. Medical:      #HTN  -Restart home 20mg  Lisinopril qday, CTM BP     #Afib, SVT - ?Paroxysmal  -Previously on Apix 5 q day, unclear if taking it as pt can't remember all his medications  -Consider Cards consult for further elucidation     #DM2  -Blood Glucose > 200 on two tests  -Metformin 500mg  BID     Labs: No further labs  Diet: Regular  C. Psychosocial: Per SW   D. Legal: 5150 expires on 10/13 at 1810     E. Disposition: Other: Substance use program     Days to Complete Medical Plan of Care Requiring Ongoing Hospitalization: Anticipated Tomorrow --->Care Needs: Placement for Alcohol  Use Program                 (Discharge Planning and EDD Info)      Decision Making Capacity:  This patient is capable of making his or her own healthcare decisions during this hospitalization.       Code Status:  Orders Placed This Encounter      Full Code - Call Code        Outcome of Discussion of Plan and Alternatives With Patient/Surrogate:    Patient agrees with the plan above: yes     Primary Care Physician:  [facility]    Averick, Al Charleston, MD   Psychiatry Resident  05/16/2024  5:24 PM     Harlene Gut, MD  Psychiatry attending

## 2024-05-17 NOTE — Utilization Review (Addendum)
===========  Authorization Summary====================  -Primary Insurance/Health Plan Name:Payor: MEDI-CAL / Plan: [place] [employer] BH / Product Type: Medicaid Managed Care /   -BH is authorized by [employer] and HB claim should go to Medi-Cal.  Do not send claim to [facility]  -Authorization/Reference/Tracking Number: [id number] per [employer]  -Secondary Insurance: None  - Physician admit order: INPATIENT 05/16/2024 @ 1632  -Utilization review: Currie NOVAK., RN PH: [phone] FAX: [phone]  =============Running Summary============================  05/16/2024/Inpatient/Acute -  -----------------------------------------------------------------------------------------------------    CONCURRENT REVIEW 10/13-10/16:

## 2024-05-17 NOTE — Plan of Care (Signed)
 Problem: Promotion of Mental Health and Safety  Goal: The pt remains safe, receives appropriate treatment and achieves outcomes (psychologically, psychosocially, physically, and spiritually) within the limitations of the disease process by discharge.  Outcome: Progressing  Flowsheets  Taken 05/17/2024 1059 by Blair Lenis, RN  Outcome Evaluation (rationale for progressing/not progessing) every shift: please see note  Individualized Interventions/Recommendations #1: pt encouraged to take meds if asked to, attend groups, have good self disclosure of thougths and feelings  Taken 05/17/2024 1000 by Blair Lenis, RN  Patient /Family stated Goal: to take meds if asked to  Taken 05/16/2024 2221 by Atanacio, Janel, RN  Guidelines:   Inpatient Nursing Guidelines   Addiction, Chemical Dependence and Withdrawal   Mood disorder  Note: Pt presents as dressed in hospital attire and presents with an anxious, depressed, irritable mood this am.  Pt slept a lot last night and pt reported that he caught up on sleep but that he feels more depressed today compared with yesterday and verbalized that he doesn't feel like being around people this am.  Pt verbalized request to remove mother from the release of information and elaborated further stating that they have good lives, go on vacations, but they can't come to the hospital to visit me?.  Just take everyone off my list, its only my mother that's on the list. Md made aware and form was amended this am per pt request.  Pt denies si/hi/a/vh.  Pt reported feeling depressed and anxious but just wanted to eat and go back to bed. Pt had adequate intake of food and fluids this am. Ciwa at 0800=6. Pt has slight tremors.  Pt endorsed some night sweats but did not have visible moisture or sweat when observed this am.  Pt presented with poor eye contact this am and socially withdrawn and isolative to self. No groups, no social interactions initiated as of 1100. cooperative, directable  otherwise.  NAD. No flu like sxs noted or observed. Steady gait. No pain. Goal met. Will continue to monitor q 10 minutes for safety.

## 2024-05-18 DIAGNOSIS — R001 Bradycardia, unspecified: Secondary | ICD-10-CM

## 2024-05-18 DIAGNOSIS — R9431 Abnormal electrocardiogram [ECG] [EKG]: Secondary | ICD-10-CM

## 2024-05-18 DIAGNOSIS — F10120 Alcohol abuse with intoxication, uncomplicated: Secondary | ICD-10-CM

## 2024-05-18 DIAGNOSIS — I498 Other specified cardiac arrhythmias: Secondary | ICD-10-CM

## 2024-05-18 LAB — ECG 12-LEAD
ATRIAL RATE: 59 {beats}/min
P AXIS: 21 degrees
PR INTERVAL: 180 ms
QRS INTERVAL/DURATION: 118 ms
QT: 406 ms
QTc (Bazett): 401 ms
QTc (Fredericia): 403 ms
R AXIS: -12 degrees
T AXIS: -1 degrees
VENTRICULAR RATE: 59 {beats}/min

## 2024-05-18 MED ORDER — SERTRALINE HCL 50 MG OR TABS
50.0000 mg | ORAL_TABLET | Freq: Every day | ORAL | Status: DC
Start: 2024-05-18 — End: 2024-05-19
  Administered 2024-05-18 – 2024-05-19 (×2): 50 mg via ORAL
  Filled 2024-05-18 (×2): qty 1

## 2024-05-18 MED ORDER — LOPERAMIDE HCL 2 MG OR CAPS
2.0000 mg | ORAL_CAPSULE | Freq: Four times a day (QID) | ORAL | Status: DC | PRN
Start: 2024-05-18 — End: 2024-05-22

## 2024-05-18 NOTE — Plan of Care (Addendum)
 Problem: Promotion of Mental Health and Safety  Goal: The pt remains safe, receives appropriate treatment and achieves outcomes (psychologically, psychosocially, physically, and spiritually) within the limitations of the disease process by discharge.  Outcome: Not Progressing  Flowsheets  Taken 05/18/2024 1428 by Delta Lipps, RN  Patient /Family stated Goal: provided a goal to be more engaged in milieu therapy  Taken 05/17/2024 1059 by Blair Lenis, RN  Outcome Evaluation (rationale for progressing/not progessing) every shift: please see note  Individualized Interventions/Recommendations #1: pt encouraged to take meds if asked to, attend groups, have good self disclosure of thougths and feelings  Taken 05/16/2024 2221 by Atanacio, Janel, RN  Guidelines:   Inpatient Nursing Guidelines   Addiction, Chemical Dependence and Withdrawal   Mood disorder  Individualized Interventions/Recommendations #3: Encourage and educate patient in medication compliance  Patient has been largely withdrawn to his room, laying in bed much of the earlier part of the shift. Was visible for breakfest and lunch, keeping to himself, avoiding peer interaction. He then retreated back to his room and back to bed. He was polite but offered minimal disclosure.  His mother apparently called the unit this evening. Writer went to ask patient if he wanted to provide consent to speak to his mom, or if he wanted to speak to his mom. Patient declined, stated he was not going to change his mind once it is made up. Staff conveyed to the mother that his presence could not be confirmed or denied, and general rules were outlined in general form. Patients mom called back shortly afterwards and spoke to Clinical research associate. Writer also explained that the patient's presence could not be confirmed or denied, but a message could be taken and if this patient was in the hospital the message can be releyed. Patient's mom grew immediately upset, concerned, stated a  previous day the staff informed her about patient's status and was reassuring, and now she was being told nothing. She continued to express concern that patient is withdrawing, that he has A-fib, was worried he can Die! She felt the hospital was choosing to withhold information from her, expresed concern I Know his Hold Expired today, I'm concerned he's being held there against his will.SABRASABRAI don't know if I need to hire a Clinical research associate...? Writer offered general reassurance, and eventually she appeared less upset and conversation was terminated.   Writer went to reley the message left by his mother, patient was Solicitor, had even been swearing under his breath when Clinical research associate was outside his door. He asked how does my mom know I'm here?SABRA As Clinical research associate spoke with him further, he calmed, he also recalled that he had texted her before arriving here to notify her he was coming here. He apologized to Clinical research associate, he identified his mother is at present a trigger for him. He explained his reasons to wish to focus on himself without being in communication with his mom at this time. Writer also performed a CIWA as his hands presented more tremulous than this morning, and his face appeared with light diaphoretic sweat to his forhead, sweaty palms, He did state when he was in his room things got real weird, and I don't know what I was seeing, I closed my eyes, it was gone. He denied active/persistent avh. His CIWA was 11, he was administered po prn valium  10mg  with benefit received. His follow up score was 5. His affect brightened during extended 1:1, he presents motivated for treatment. Remains pleasant, polite, and cooperative, but withdrawn, keeps to  himself, presents with bland mood and congruent affect. Active po intake, compliant with scheduled medications. Was encouraged to alert staff if he feels withdrawal symptoms in-between being assessed.

## 2024-05-18 NOTE — Plan of Care (Signed)
 Problem: Promotion of Mental Health and Safety  Goal: The pt remains safe, receives appropriate treatment and achieves outcomes (psychologically, psychosocially, physically, and spiritually) within the limitations of the disease process by discharge.  Outcome: Progressing  Flowsheets  Taken 05/18/2024 2000 by Zurie Platas, RN  Patient /Family stated Goal: sleep 8 hrs  Taken 05/17/2024 1059 by Blair Lenis, RN  Individualized Interventions/Recommendations #1: pt encouraged to take meds if asked to, attend groups, have good self disclosure of thougths and feelings  Taken 05/16/2024 2221 by Charlyn Vialpando, RN  Guidelines:   Inpatient Nursing Guidelines   Addiction, Chemical Dependence and Withdrawal   Mood disorder  Individualized Interventions/Recommendations #2: Support sleep hygiene  Individualized Interventions/Recommendations #3: Encourage and educate patient in medication compliance  Note: Pt denies SI/HI/AVH, pt visible in the milieu, anxious about mom calling unit, pt reassured about HIPAA, pt otherwise cooperative, watching movie with peers, received PRN hydroxyzine for anxiety, CIWA 6, pt medication compliant, no s/sx of self harm, harm towards others, agitation. Pt slept 8.5 hrs, chest rise and fall, unlabored breaths

## 2024-05-18 NOTE — Utilization Review (Signed)
==========  Authorization Summary====================  -Primary Insurance/Health Plan Name:Payor: MEDI-CAL / Plan: [place] [employer] [facility] / Product Type: Medicaid Managed Care /   -BH is authorized by [employer] and HB claim should go to Medi[employer]Cal.  Do not send claim to [facility]  -Authorization/Reference/Tracking Number: [id number] per [employer]  -Secondary Insurance: None  - Physician admit order: INPATIENT 05/16/2024 @ 1632  -Utilization review: Currie NOVAK., RN PH: [phone] FAX: [phone]  =============Running Summary============================  05/16/2024/Inpatient/Acute -05/20/2024  --------------------------------------------------------------------------------------------------

## 2024-05-18 NOTE — ED OBS Discharge (Signed)
 ED OBSERVATION Discharge Note    Disposition: Admit     Patient Name:  Toluwani Yadav III    Principal ED Observation Diagnosis:      PT seen and examined while in ED Observation.    S: S/o Navarro: 33 yo M hx of alcohol  use, DM, paroxysmal afib, not medication compliant, 5150 for danger to self, medically clear.  Admitting to [facility] for psych.  May need to watch for withdrawal.    [AC]    Key physical exam findings: pt endanger to self.     Plan in ED Observation: Observe in the ED for withdrawal.  Admitted to Psych in [facility].      Principal Procedure Performed During This Hospitalization:  Observe for withdrawal    Consultations Obtained During This Hospitalization:  Psych consult    ED Observation Course by Problem (required):  Pt medically cleared.  No signs of alcohol  withdrawal.     Reason for Admission to the Hospital / Initial Presentation:  Endanger to self     Key Physical Exam Findings at ADMIT Time:  Stable     Consultations Obtained During This Visit:  Psych     Sharyne Lana Door, MD

## 2024-05-19 DIAGNOSIS — F39 Unspecified mood [affective] disorder: Secondary | ICD-10-CM

## 2024-05-19 DIAGNOSIS — F1020 Alcohol dependence, uncomplicated: Secondary | ICD-10-CM

## 2024-05-19 LAB — ECG 12-LEAD
ATRIAL RATE: 96 {beats}/min
ECG INTERPRETATION: NORMAL
P AXIS: 60 degrees
PR INTERVAL: 202 ms
QRS INTERVAL/DURATION: 112 ms
QT: 366 ms
QTc (Bazett): 462 ms
QTc (Fredericia): 428 ms
R AXIS: -61 degrees
T AXIS: 10 degrees
VENTRICULAR RATE: 96 {beats}/min

## 2024-05-19 MED ORDER — AMLODIPINE 2.5 MG OR TABS
5.0000 mg | ORAL_TABLET | Freq: Every day | ORAL | Status: DC
Start: 2024-05-19 — End: 2024-05-21
  Administered 2024-05-19 – 2024-05-21 (×3): 5 mg via ORAL
  Filled 2024-05-19 (×3): qty 2

## 2024-05-19 MED ORDER — LISINOPRIL 5 MG OR TABS
30.0000 mg | ORAL_TABLET | Freq: Every day | ORAL | Status: DC
Start: 2024-05-20 — End: 2024-05-19

## 2024-05-19 MED ORDER — LISINOPRIL 40 MG OR TABS
20.0000 mg | ORAL_TABLET | Freq: Every day | ORAL | Status: AC
Start: 2024-05-20 — End: ?
  Administered 2024-05-20 – 2024-05-22 (×3): 20 mg via ORAL
  Filled 2024-05-19 (×3): qty 1

## 2024-05-19 MED ORDER — SERTRALINE HCL 100 MG OR TABS
100.0000 mg | ORAL_TABLET | Freq: Every day | ORAL | Status: DC
Start: 2024-05-20 — End: 2024-05-20
  Administered 2024-05-20: 100 mg via ORAL
  Filled 2024-05-19: qty 1

## 2024-05-19 MED ORDER — GABAPENTIN 400 MG OR CAPS
400.0000 mg | ORAL_CAPSULE | Freq: Three times a day (TID) | ORAL | Status: DC
Start: 2024-05-19 — End: 2024-05-20
  Administered 2024-05-19 – 2024-05-20 (×3): 400 mg via ORAL
  Filled 2024-05-19 (×3): qty 1

## 2024-05-19 NOTE — Interdisciplinary (Signed)
 BH Social Work Psychosocial Assessment:    Dvon Jiles III, 33 year old male.   Admitted to the ED on 05/14/2024  Admitted to [facility] on 05/16/2024    Documentation:    Legal Status: 72 Hour Hold--5150 for DTS started on 10/10 at 1810. Firearms Prohibition Report filed to [employer]. Paper copy placed in Consents portion of patient's record.     Voluntary since 10/13    Is Patient Minor?: No    Guardian: None    Presenting Problems: Patient was BIBM after mother called 911 for patient endorsing SI with plan to jump off the [facility], and was later seen running in traffic, in the context of alcohol  intoxication; UTox + benzodiazepine.    Problem List: Problem List[1]    Current Living Situation:  Homeless-- patient said he has been living in his car/truck (mom gave it to him but said she has it now and is not sure he will get it back).     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 Has been provided substance abuse resources and referred to residential programs on various occasions.     Assessment:  Patient is a 33 year old male with history of  bipolar disorder, alcohol  use disorder, atrial fibrillation, and diabetes, who was BIBM after mother called 911 for patient endorsing SI with plan to jump off the [facility], and was later seen  running in traffic, in the context of alcohol  intoxication; UTox positive for benzodiazepine and BAL 590. Per 5150 hold, Today at 1000 AM Drew told his mother he was going to [facility] to kill himself. At around 1800 hours Elige's mother got a call from a tenant stating he was running in traffic wanting to end his life. Mom arrived and saw Malahki in the middle of the street stating he wanted to end his life.     Per ED notes, patient arrived in restraints due to being agitated in the field. On assessment by Psychiatry the next day (10/11), he was calm and cooperative, reported he did not remember much about events leading to admission due to being intoxicated. Patient denied SI, HI, AH, and VH at that time. They obtained collateral from patient's mother who reported patient has been paranoid, erratic, reporting he is part of a gang when he is not, driving recklessly, and making suicidal threats. Patient was admitted to [facility] once medically cleared.     Since admission to [facility], he has been pleasant and cooperative with assessments. He has acknowledged drinking has been increasing and reporting interest in treatment. He remains on CIWA protocol for alcohol  withdrawal.     Action/Plan:  -Continue hospitalization for symptom stabilization and medication management.  -Assist with discharge planning and coordination of services as needed.   -Provided county substance abuse program resources and discussed  as appropriate.   -Discussed crisis house placement as disposition option per MD's request, though not cleared yet. Patient considering.     Discharge Planning:    Outpatient Follow-up Care: Yes--no current providers but will be given information prior to discharge.  Patient Expects Discharge to:: Will continue to assess for appropriate resources and referrals.                [1]   Patient Active Problem List  Diagnosis    Alcohol  withdrawal syndrome, uncomplicated (CMS-HCC)    Bipolar disorder, unspecified  (CMS-HCC)

## 2024-05-19 NOTE — Interdisciplinary (Signed)
 Spiritual Care Note    Date of Spiritual Care Visit: 05/19/24  Referred By: Elia    Spiritual Screening Provided: Yes  Date of Spiritual Screening: 05/19/24 Hennie)    Assessment: Engaged in spirituality group focused on renewal.     Interventions: Spiritual Counseling, Education, Listening, Prayer/Blessing    Outcomes: Gratitude    Will Spiritual Care provide a Follow-Up Visit? No    [place], Chaplain  05/19/2024 5:52 PM    To request Spiritual Care services, please place an order for IP Consult to Spiritual Care.  For urgent/STAT requests, please also page [phone].

## 2024-05-19 NOTE — Plan of Care (Incomplete)
 Problem: Promotion of Mental Health and Safety  Goal: The pt remains safe, receives appropriate treatment and achieves outcomes (psychologically, psychosocially, physically, and spiritually) within the limitations of the disease process by discharge.  Outcome: Not Progressing  Flowsheets (Taken 05/19/2024 0854)  Patient /Family stated Goal: rest  Guidelines:   Inpatient Nursing Guidelines   Mood disorder   Addiction, Chemical Dependence and Withdrawal  Outcome Evaluation (rationale for progressing/not progessing) every shift: Christopher [name] was received in bed asleep. he was awakened for breakfast. Readily reported that he did not sleep well last night and was asking for sleep medication. Reports feeling anxious about not being able to sleep. Mood reported also is depressed. CIWA was an 8 in the morning. Anxious, sweaty and tremulous. Given Valium . He reports this as effective. He is denying any SI. Denies any AVH/HI and paranoia. Expressed wish to get rehab treatment from [facility]. Seen mostly in milieu, listened to music, spent patio time, read a book, attended spiritual group and socialized with peers.   CIWA improved   Individualized Interventions/Recommendations #1: provide supportiuve and therapeutic contacts  Individualized Interventions/Recommendations #2: encourage milieu participation by attending groups and interacting with peers

## 2024-05-19 NOTE — Progress Notes (Signed)
 PSYCHIATRY PROGRESS NOTE    ID: Christopher [name] is a 33 year old male with history of bipolar disorder, alcohol  use disorder, atrial fibrillation, diabetes mellitus admitted for alcohol  intoxication and agitation and reports of suicide attempt trying to run into traffic. Admitted on 5150 for DTS, expired 10/13 at 18:10. Pt signed voluntary on 10/13.      Target Symptoms: low mood, alcohol  cravings,    Interval Events:  Sleep: slept 8.5 hours  NAEON  Per nursing, Pt denies SI/HI/AVH, pt polite and cooperative upon approach, visible in the milieu at times, reports detox symptoms of tactile hallucinations, anxiety and nausea, received PRN valium  for CIWA of 9, pt medication compliant, no s/sx of self harm, harm towards others, agitation. Pt slept 8.5 hrs, chest rise and fall, unlabored breaths      Subjective:   This morning patient was in bed, alert and engaging the team appropriately. Says that the trazodone he took last night did not work. Says he slept very poorly last night, describing vivid dreams in which he is receiving text messages on his phone that he cannot understand because they are encrypted. He also describes disturbing visual hallucinations last night that he was hesitant to describe in detail, but mentioned seeing a cheetah with blood pouring out of the patterns on its fur. He says these are things that I cannot even think of. He was noted to have tremors and diaphoresis and responded well to a PRN dose of diazepam  yesterday.     He has not been spending much time in the dayroom, but noted that he was planning on engaging in more activities today.    He also brought up his concern about communication with outside friends and family. Says he is fine with his mother knowing he is in the unit and that he is fine/seeking treatment. However, he initially stated he does not want to speak with anyone and does not want anyone to visit him. Upon further interview, he said he his preference was  to be eventually discharged with family, and agreed to reach out them (including his mother) today using the [facility] phone. His biggest concern is being discharged to a location where he can seek help for his AUD, while simultaneously being able to look for work.    On further interview, patient reports that he is feeling not great, reports he did not sleep well because he was having racing thoughts.  Later approached this writer asking who he can talk to in order to [name] discharge.  Clarified his concern was that he wants to be in charge of where he goes when he discharges, and he is worried that the team is going to make him go to a crisis house if he doesn't want to.  Discussed with the patient that he is here voluntarily, and that we would like him to stay in order to continue treatment for alcohol  withdrawal and depression, and that he will not be forced to go to another facility.  He expressed appreciation of the clarification.  Encouraged patient to contact his family to discuss his plans for post-hospitalization.      Family, Psychiatric, or Social History Updates:   None    Scheduled Medications:   lisinopril  20 mg Daily    metFORMIN  500 mg BID    naltrexone  50 mg Daily    sertraline  50 mg Daily    thiamine   100 mg Daily    traZODone  50  mg HS       PRN Medications:   acetaminophen   650 mg Q4H PRN      aluminum-magnesium-simethicone  30 mL Q6H PRN      diazePAM   10 mg Q6H PRN 10 mg at 05/18/24 1615    hydrOXYzine HCL  50 mg Q4H PRN 50 mg at 05/18/24 2040    loperamide  2 mg Q6H PRN      LORazepam  1 mg Q4H PRN      magnesium hydroxide  30 mL Nightly PRN      OLANZapine  10 mg Q4H PRN         Review of Systems:   Denies pain,  headache, nausea, vomiting, hematuria, dysuria, vision changes or difficulty ambulating.   He notes improvement in his diarrhea, but he is unsure to what degree.     Objective:   05/17/24  2055 05/18/24  0800 05/18/24  1929 05/19/24  0716   BP: (!) 140/93 158/87 (!) 136/99 (!) 135/97    Pulse: 58 62 79 85   Temp:  98.2 F (36.8 C) 98 F (36.7 C) 98.5 F (36.9 C)   Resp:  18 16 17    SpO2:  99%  99%     Pain: Pain Score: 0  Night Shift Hours of Sleep: 8.5 hrs.  Administered PRN meds in the last 24 hours (or last weekend):   Dose: Diazepam  10 mg  Time: 10/14  1615 10/15 0921      Mental Status Examination:    Appearance: Appears stated age, lying in bed under sheets in hospital gown. Fair grooming and hygiene   Behavior: Calm, cooperative, minimal eye contact.  Motor / abnormal involuntary movements: No psychomotor agitation or retardation. No tics. No tremors noted during interview   Gait: Steady  Speech: Normal rate, volume and rhythm.  Mood: I'm feeling tired  Affect: Congruent, dysphoric  Thought process: coherent, logical  Associations: linear and goal-directed  Thought content: Help seeking, remorseful, no current SI or HI.   Perceptions: no abnl. Endorses VH last night.   Insight / judgment: Fair/fair, understands need for treatment and willing to accept treatment, despite history of repeated hospitalization for substance use   Sensorium / orientation / intellectual functions: AOx4    New Labs/Studies:   None    Narrative Assessment:   Christopher [name] is a 33 year old male with history of bipolar vs unipolar disorder, alcohol  use disorder, atrial fibrillation, diabetes mellitus admitted for alcohol  intoxication and agitation and reports of suicide attempt trying to run into traffic.    Patient continues to experience withdrawal symptoms, including visual hallucinations, diaphoresis and tremors. He also continues to report vivid nightmares and poor sleep. He was counseled on proper sleep hygiene and he agreed to attempt to remain awake throughout the daytime. Plan to add gabapentin 400 mg TID to aid with withdrawal symptom management. He continues to express desire to quit drinking, and plans to reach out to his family today to assess possibility of getting discharged to their  home. Is interested in AA, and potentially other alcohol  use programs that allow him to look for a job. IP hospitalization warranted for ongoing medication mgmt, treatment planning, and safe dispo planning.     A comprehensive suicide risk assessment was performed and the patient was assessed to be at a intermediate acute risk of self-harm due to the following factors:  - Suicidal ideation: Denies    - Plan: Denies  - Intent:  Denies  - Suicidal/Self Harm Behaviors: Patient could not or would not answer  - Modifiable risk factors: precipitating stressors and intoxication  - Non-modifiable risk factors: previous suicide attempts, male gender, and single status  - Protective factors: coping skills and therapeutic relationships    A violence risk assessment was also performed. The following behavioral risk factors are associated with acute violence risk and have been present within the past 24 hours: irritability. Based on these factors, this patient's acute risk of violence in the inpatient setting in the next 24 hours is assessed to be low. Historical (non-modifiable) risk factors for violence in this patient include: prior violent acts.    Active Problem List:  - Mood Disorder, Unspecified   - r/o MDD vs substance induced mood disorder   - Alcohol  Use Disorder   - Insomnia    Plan:  A. Psychiatric:   # Mood disorder, unspecified  - Increase from Sertraline 50 mg to 100 mg q24h    # Alcohol  Use Disorder  # Substance induced mood d/o  - Naltrexone 50 mg q24h  - thiamine  100mg  q24h  - [facility] clinic  - Behavioral monitoring q49m    # Insomnia   - Trazodone 50 mg qPM    B. Medical:   # Alcohol  Withdrawal  # Diarrhea, improving  - CIWA protocol  - START gabapentin 400 mg q8h to manage withdrawal symptoms  - diazepam  10mg  q6h PRN for breakthrough withdrawal symptoms   - loperamide 2mg  q6h PRN    # HTN  - Continue home 20mg  Lisinopril   - Add Amlodipine 5mg  for elevated BPs  - CTM BP - SBP continues to be mildly  elevated in the 150s and DBP to 90s    # DM2  - Blood Glucose > 200 on two tests, A1C 7.4  - Metformin 500mg  BID     # Afib, SVT - ?Paroxysmal  - Previously on Apix 5 q day, unclear if taking it as pt can't remember all his medications  - EKG was normal  -Consider Cards consult for further elucidation     Audit-C Score: 11  4 or more is considered unhealthy alcohol  use in men.    The Centers for Disease Control and Prevention and [employer] on Aging are used as references to provide feedback to the patient regarding his/her alcohol  use in comparison to national norms.    http://www.cdc.gov/alcohol /fact-sheets/alcohol -use.htm   https://www.nia.nih.gov/health/publication/alcohol -use-older-people    Patient engaged in a brief intervention for alcohol  use. The patient engaged in a brief intervention that included the following components:    Feedback concerning the quantity and frequency of alcohol  consumed in comparison to national norms,  Discussion of the possible negative physical, emotional, social and occupational consequences of significant alcohol  use,  Discussion of the overall severity of patient's alcohol  use.    Based on this discussion patient is deemed to be in the contemplative stage of change regarding their alcohol  use.    Patient was engaged in a joint decision making process regarding their alcohol  use and plans for follow-up. Patient accepted referral for the following referrals: residential rehabilitation programs and sober living facilities.  C.  Psychosocial: per SW note.  D.  Legal: vol, previously on 5150 for DTS starting on 10/10  E.  Disposition: to be determined. likely substance use program    Days to Complete Medical Plan of Care Requiring Ongoing Hospitalization: 2 days out --->Care Needs: Placement to Acohol Use Program.                  (  Discharge Planning and EDD Info)    This patient and his care were discussed with the attending psychiatrist, Dr. Ed.    Written with  the help of Serene Childs, MS3    Hoy Seltzer, MD  [facility], PGY1    Attending Psychiatrist Addendum 05/19/24    I have seen and examined the patient and reviewed the history, presentation, progress, and diagnosis, with the resident. Dr. Seltzer was physically present with the medical student during the performance of the history, examination and medical decision-making. I agree with the findings and plan as documented with additional comments made below, and I participated in the medical decision making with the resident physician. My edits are made in underlined/italicized text.     The patient is appropriate for continued psychiatric hospitalization for safety, diagnostic clarification, medication management, stabilization, and safe dispo planning.    Harlene Macario Ed, MD  Psychiatry Attending

## 2024-05-20 MED ORDER — GABAPENTIN 600 MG OR TABS
600.0000 mg | ORAL_TABLET | Freq: Three times a day (TID) | ORAL | Status: DC
Start: 2024-05-20 — End: 2024-05-21
  Administered 2024-05-20 – 2024-05-21 (×3): 600 mg via ORAL
  Filled 2024-05-20 (×3): qty 1

## 2024-05-20 MED ORDER — SERTRALINE HCL 50 MG OR TABS
150.0000 mg | ORAL_TABLET | Freq: Every day | ORAL | Status: DC
Start: 2024-05-21 — End: 2024-05-22
  Administered 2024-05-21 – 2024-05-22 (×2): 150 mg via ORAL
  Filled 2024-05-20 (×2): qty 1

## 2024-05-20 MED ORDER — THERA M PLUS OR TABS
1.0000 | ORAL_TABLET | Freq: Every day | ORAL | Status: DC
Start: 2024-05-21 — End: 2024-05-28
  Administered 2024-05-21 – 2024-05-22 (×2): 1 via ORAL
  Filled 2024-05-20 (×2): qty 1

## 2024-05-20 MED ORDER — THIAMINE HCL 100 MG OR TABS (CUSTOM)
200.0000 mg | ORAL_TABLET | Freq: Every day | ORAL | Status: DC
Start: 2024-05-21 — End: 2024-05-20

## 2024-05-20 MED ORDER — THIAMINE HCL 100 MG OR TABS (CUSTOM)
200.0000 mg | ORAL_TABLET | Freq: Once | ORAL | Status: DC
Start: 2024-05-20 — End: 2024-05-20

## 2024-05-20 MED ORDER — FOLIC ACID 1 MG OR TABS
1.0000 mg | ORAL_TABLET | Freq: Every day | ORAL | Status: DC
Start: 2024-05-21 — End: 2024-05-28
  Administered 2024-05-21 – 2024-05-22 (×2): 1 mg via ORAL
  Filled 2024-05-20 (×2): qty 1

## 2024-05-20 NOTE — Plan of Care (Signed)
 Problem: Promotion of Mental Health and Safety  Goal: The pt remains safe, receives appropriate treatment and achieves outcomes (psychologically, psychosocially, physically, and spiritually) within the limitations of the disease process by discharge.  Outcome: Progressing  Flowsheets (Taken 05/20/2024 0117)  Guidelines:   Inpatient Nursing Guidelines   Mental Health Assessment   Mood disorder   Addiction, Chemical Dependence and Withdrawal  Individualized Interventions/Recommendations #1: Provide therapeutic communication and active listening. Encouraged to verbalize thoughts and feelings while assessing current mental status  Individualized Interventions/Recommendations #2: Maintain safety precautions and continue rounding every 10 minutes  Individualized Interventions/Recommendations #3: Administer medications, observe for side effects, assess need for PRNs to target symptoms  Note: Patient has been visible in the dayroom and watching movies.  Friendly and cooperative upon approach.  When asked how he is feeling, patient reports feeling better while raising both arms forward showing mild tremors to each extremity.  He also reported having a lot of anxiety but learning a lot of information since being admitted to the hospital and willingness to stop drinking ETOH and work on his health.  Patient remains on CIWA and given PRN Valium  x 1 with good effect.  Patient on rechecked observed to be sleeping comfortably.  Patient had reported he was having tactile and auditory hallucinations earlier in his detox but now denies any hallucinations.  Patient's mood has been improving and denies any thoughts to harm himself or others.  Compliant with all medications with no unsafe behaviors noted.    0600   Patient slept 8.5 hours through out the night with no acute distress noted. Respirations even & unlabored as observed during q 10 minutes safety check.  Plan of care ongoing. No further concerns as of present.      PRNs  None required

## 2024-05-20 NOTE — Progress Notes (Signed)
 PSYCHIATRY PROGRESS NOTE    ID: Christopher [name] is a 33 year old male with history of bipolar vs unipolar disorder, alcohol  use disorder, atrial fibrillation, diabetes mellitus admitted for alcohol  intoxication and agitation and reports of suicide attempt trying to run into traffic. Admitted on 5150 for DTS, expired 10/13 at 18:10. Pt signed voluntary on 10/13.      Target Symptoms: low mood, alcohol  cravings, alcohol  withdrawal    Interval Events:  Sleep: slept 8.5 hours  NAEON  Per nursing, Patient slept 8.5 hours through out the night with no acute distress noted. Respirations even & unlabored as observed during q 10 minutes safety check. Plan of care ongoing. No further concerns as of present      Subjective:   This morning, patient was interviewed in the day room while he was eating breakfast. He endorses decreased appetite, but says he overall feels much better. He says he still experiences occasional tremors, but denies any auditory or visual hallucinations. He still is experiencing vivid nightmares, but slept throughout the night. He attributes his improved sleep to an evening PM dose of diazepam  that he received before going to bed.    He wants his family to be involved in his disposition planning, and states his biggest worry is that he does not want to be discharged while experiencing withdrawal symptoms. He expresses that he is thankful for the support he has received from his family since calling his mother yesterday.     Near the end of the interview, he began talking about his mood outside the hospital. He became tearful while describing a period in his life when he felt sad, despite the fact that life was good, he was making money and had stable housing. Says he deals with chronic low mood and has not spoken to a therapist about these feelings.     On further interview, patient reports that he is feeling moderate, reports that he got some sleep, identifies that he had some diazepam   prior to sleep and that this may have helped him sleep.  Discussed discharge plan, reports that he plans to work with his family to do an outpatient program, that they will be his support system and he feels comfortable with that.  Reports that his family did not know how much he had been drinking, and it felt good to tell them the truth.  Reports that he continues to feel like he is withdrawing a little, but that he feels less shaky.  Discussed the need to finish detoxing before he leaves due to the concern for suicidal thoughts to return if he is not treated during detox, and he voiced understanding.    Family, Psychiatric, or Social History Updates:   Collateral obtained from mother this morning, who endorsed increased frequency in his alcohol -induced erratic and violent behavior over the past few months. Patient's family is very eager to help him, and have provided housing/financial support to him many times in the past. Upon his discharge, says she is willing to support him financially and with housing, but will only do so if he agrees to participate in an outpatient alcohol  program. She is looking to rent an apartment for him at [employer] and has identified [facility] as a preferred outpatient facility due to its proximity to [employer].     Scheduled Medications:   amLODIPine  5 mg Daily    gabapentin  400 mg TID    lisinopril  20 mg  Daily    metFORMIN  500 mg BID    naltrexone  50 mg Daily    sertraline  100 mg Daily    thiamine   100 mg Daily    traZODone  50 mg HS       PRN Medications:   acetaminophen   650 mg Q4H PRN      aluminum-magnesium-simethicone  30 mL Q6H PRN      diazePAM   10 mg Q6H PRN 10 mg at 05/19/24 2044    hydrOXYzine HCL  50 mg Q4H PRN 50 mg at 05/18/24 2040    loperamide  2 mg Q6H PRN      LORazepam  1 mg Q4H PRN      magnesium hydroxide  30 mL Nightly PRN      OLANZapine  10 mg Q4H PRN         Review of Systems:   Denies pain,  headache, nausea, vomiting,  hematuria, dysuria, vision changes or difficulty ambulating.   He no longer reports diarrhea.    Objective:   05/19/24  0716 05/19/24  1500 05/19/24  2000 05/20/24  0800   BP: (!) 135/97 (!) 133/92 (!) 143/103 (!) 143/93   Pulse: 85 69 93 70   Temp: 98.5 F (36.9 C)  98.1 F (36.7 C) 98.3 F (36.8 C)   Resp: 17  18 18    SpO2: 99%   100%     Pain: Pain Score: 0  Night Shift Hours of Sleep: 8.5 hrs.  Administered PRN meds in the last 24 hours (or last weekend):   Dose: Diazepam  10 mg  Time: 10/14  1615 10/15 0921      Mental Status Examination:    Appearance: Appears stated age, sitting in dayroom eating breakfast.   Behavior: Calm, cooperative, good eye contact.  Motor / abnormal involuntary movements: No psychomotor agitation or retardation. No tics. Mild bilateral hand tremors when arms are outstretched   Gait: Steady, independent   Speech: Normal rate, volume and rhythm.  Mood: I'm feeling good man  Affect: Congruent, dysphoric  Thought process: coherent, logical  Associations: linear, goal-directed, and over-inclusive  Thought content: Help seeking, remorseful, no current SI or HI.   Perceptions: no abnl.   Insight / judgment: Fair/fair, understands need for treatment and willing to accept treatment, despite history of repeated hospitalization for substance use   Sensorium / orientation / intellectual functions: AOx4    New Labs/Studies:   None    Narrative Assessment:   Christopher [name] is a 33 year old male with history of bipolar vs unipolar disorder, alcohol  use disorder, atrial fibrillation, diabetes mellitus admitted for alcohol  intoxication and agitation and reports of suicide attempt trying to run into traffic.    05/20/2024  Patient is looking markedly improved this morning, although he is still receiving PRN diazepam  1-2 times daily. His sleep is improving, and he has steadily been engaging more with daytime activities. He has been communicating with family more over the phone recently, and  they have expressed desire to help him upon discharge. Current discharge preference is going to a crisis house, and then to apartment organized by mother. Family also has preference for [facility] outpatient treatment program. Patient continues to require inpatient psychiatric hospitalization for diagnostic evaluation, treatment planning, medication planning, and safe disposition planning.     A comprehensive suicide risk assessment was performed and the patient was assessed to be at a intermediate acute risk of self-harm due to the following  factors:  - Suicidal ideation: Denies    - Plan: Denies  - Intent: Denies  - Suicidal/Self Harm Behaviors: Patient could not or would not answer  - Modifiable risk factors: precipitating stressors and intoxication  - Non-modifiable risk factors: previous suicide attempts, male gender, and single status  - Protective factors: coping skills and therapeutic relationships    A violence risk assessment was also performed. The following behavioral risk factors are associated with acute violence risk and have been present within the past 24 hours: irritability. Based on these factors, this patient's acute risk of violence in the inpatient setting in the next 24 hours is assessed to be low. Historical (non-modifiable) risk factors for violence in this patient include: prior violent acts.    Active Problem List:  #Mood Disorder, Unspecified   #r/o MDD vs substance induced mood disorder   #Alcohol  Use Disorder   #Insomnia    Plan:  A. Psychiatric:   # Mood disorder, unspecified  - Increase from Sertraline to 150 mg PO daily to target depressed mood  - Psychology consulted    # Alcohol  Use Disorder  # Substance induced mood d/o  - Naltrexone 50 mg PO daily to target alcohol  cravings  - thiamine  100mg  PO daily   - [facility]  - Behavioral monitoring q26m    # Insomnia   - Trazodone 50 mg qPM    B. Medical:   # Alcohol  Withdrawal  # Diarrhea, improving  - CIWA protocol  -  increase gabapentin 600 mg PO TID to manage withdrawal symptoms  - diazepam  10mg  PO q6h PRN for breakthrough withdrawal symptoms   - loperamide 2mg  PO q6h PRN diarrhea    # HTN  - Continue Lisinopril 20mg  PO daily  - Add Amlodipine 5mg  PO daily for elevated BPs  - CTM BP - SBP continues to be mildly elevated in the 150s and DBP to 90s    # DM2  - Blood Glucose > 200 on two tests, A1C 7.4  - Metformin 500mg  PO BID     # Afib, SVT - ?Paroxysmal  - Previously on Apixiban 5mg  PO daily, unclear if taking it as pt can't remember all his medications  - EKG was normal  -Consider Cards consult for further elucidation     Audit-C Score: 11  4 or more is considered unhealthy alcohol  use in men.    The Centers for Disease Control and Prevention and [employer] on Aging are used as references to provide feedback to the patient regarding his/her alcohol  use in comparison to national norms.    http://www.cdc.gov/alcohol /fact-sheets/alcohol -use.htm   https://www.nia.nih.gov/health/publication/alcohol -use-older-people    Patient engaged in a brief intervention for alcohol  use. The patient engaged in a brief intervention that included the following components:    Feedback concerning the quantity and frequency of alcohol  consumed in comparison to national norms,  Discussion of the possible negative physical, emotional, social and occupational consequences of significant alcohol  use,  Discussion of the overall severity of patient's alcohol  use.    Based on this discussion patient is deemed to be in the contemplative stage of change regarding their alcohol  use.    Patient was engaged in a joint decision making process regarding their alcohol  use and plans for follow-up. Patient accepted referral for the following referrals: residential rehabilitation programs and sober living facilities.  C.  Psychosocial: per SW note.  D.  Legal: vol, previously on 5150 for DTS starting on 10/10  E.  Disposition: to be  determined. likely  substance use program    Days to Complete Medical Plan of Care Requiring Ongoing Hospitalization: 2 days out --->Care Needs: Placement to Acohol Use Program.                  (Discharge Planning and EDD Info)    This patient and his care were discussed with the attending psychiatrist, Dr. Suzanna.    Written with the help of Serene Childs, MS3    Hoy Seltzer, MD  [facility], PGY1    Attending Psychiatrist Addendum 05/20/24    I have seen and examined the patient and reviewed the history, presentation, progress, and diagnosis, with the resident. Dr. Seltzer was physically present with the medical student during the performance of the history, examination and medical decision-making. I agree with the findings and plan as documented with additional comments made below, and I participated in the medical decision making with the resident physician. My edits are made in underlined/italicized text.     The patient is appropriate for continued psychiatric hospitalization for safety, diagnostic clarification, medication management, stabilization, and safe dispo planning.    Harlene Macario Gut, MD  Psychiatry Attending

## 2024-05-20 NOTE — Plan of Care (Signed)
 Problem: Promotion of Mental Health and Safety  Goal: The pt remains safe, receives appropriate treatment and achieves outcomes (psychologically, psychosocially, physically, and spiritually) within the limitations of the disease process by discharge.  Flowsheets  Taken 05/20/2024 1039 by Nate Common, RN  Outcome Evaluation (rationale for progressing/not progessing) every shift: Please see note  Individualized Interventions/Recommendations #1: Encourage patient to verbalize feelings and needs  Individualized Interventions/Recommendations #2: Encourage patient to actively pariticpate in groups  Individualized Interventions/Recommendations #3: Promote medication compliance and assess the need for PRNs to target symptoms  Individualized Interventions/Recommendations #4: Encourage patient to maintain good personal hygiene  Taken 05/20/2024 0749 by Cylah Fannin, RN  Patient /Family stated Goal: Think I'll take a shower today  Taken 05/20/2024 0117 by Janne Lonni Rush, RN  Guidelines:   Inpatient Nursing Guidelines   Mental Health Assessment   Mood disorder   Addiction, Chemical Dependence and Withdrawal  Note: Received patient in the dayroom appropriately socializing with peers. Christopher [name] appears well groomed and is appropriately dressed in hospital scrub pants and t-shirt.    Patient is pleasant and cooperative when engaged in conversation.  Patient is medication compliant and continues to eat meals with good appetite.  Pryor denies SI, HI, AVH at this time and endorses feeling better.  Patient states, I'm happy I'm getting the help here.  I probably should've addressed my mental health issues earlier.  Visual tremors noted at 0920 PRN valium  10mg  po given for CIWA > 8.  Patient was observed playing ping pong with peers and actively participating in unit activities.  Patient is future oriented as he continues to endorse his plans to have follow up with an out patient psychologist.  No c/o any  pain, distress or discomfort at this time.

## 2024-05-20 NOTE — Plan of Care (Signed)
 Problem: Promotion of Mental Health and Safety  Goal: The pt remains safe, receives appropriate treatment and achieves outcomes (psychologically, psychosocially, physically, and spiritually) within the limitations of the disease process by discharge.  Outcome: Progressing  Flowsheets  Taken 05/20/2024 2215 by Arlyss Hard, RN  Outcome Evaluation (rationale for progressing/not progessing) every shift: See note.  Individualized Interventions/Recommendations #1: Provide 1:1 therapeutic nursing communication and active listening.  Individualized Interventions/Recommendations #2: Encourage patient to engage in the milieu and actively participate in groups.  Individualized Interventions/Recommendations #3: Promote medication compliance to target symptoms and assess the need for PRNs.  Individualized Interventions/Recommendations #4: Promote good sleep hygiene.  Individualized Interventions/Recommendations #5: Maintained Q10 minute rounding for safety, resipiration checks and emergent needs.  Taken 05/20/2024 2040 by Arlyss Hard, RN  Patient /Family stated Goal:  I hope I can sleep through the night without nightmares.  Taken 05/20/2024 0117 by Janne Lonni Rush, RN  Guidelines:   Inpatient Nursing Guidelines   Mental Health Assessment   Mood disorder   Addiction, Chemical Dependence and Withdrawal  Note: Assumed care of patient while he was in the dayroom talking on the phone. He is polite upon approach and cooperative with vital signs, nursing shift assessment, CIWA assessment and medication administration. He currently denies SI/HI/AVH but endorses anxiety regarding being in the hospital, being sober and his nightmares. He endorses pain on his lower legs but reports that he believes it is from when he fell down the stairs prior to admission, he was agreeable to trying PRN tylenol  (see eMAR). He verbalized concern regarding possibly hitting his head at the time, but denies having a headache in the past  couple days - of note pt had head CT in the ED prior to admission with no abnormalities found. He is having some ETOH withdrawal symptoms and was compliant with PRN valium  administration (see eMAR). He feels his biggest concern right now is his nightmares, as it is starting to prevent him from sleeping. He isn't positive, but he believes the nightmares started happening roughly a year ago when he last went through detox - describes tremor, AVH, tactile disturbances, possible seizure and subsequent hospitalization. He says he isn't sure though because he always drinks before bed at home, It is hard to tell what was there the whole time and just being hidden by being drunk. He is interested in trying a medication for nightmares and is agreeable to talking to his team about it tomorrow. He reports for now, the valium  is helpful but he acknowledges that it is temporary and is agreeable to that plan, I don't need to get off alcohol  just to get hooked on this. BP was mildly elevated on initial assessment (DBP 105) but pt declined any chest pain/SOB/headache, pt agreeable to notify staff if any develop before vital signs are rechecked in an hour. Thought process is linear and goal-oriented. Pt is able to make his needs known in an appropriate manner and is currently future oriented.    2130: Pt was agreeable to vital sign recheck. DBP now 90, which is roughly where the pt's DBP has been this hospital admission. He awknowledges that he has HTN that isn't controlled well since he wasn't taking his meds at home, but also reports that he feels more comfortable now (less pain/anxiety) than he did during the initial vital signs check this shift. Pt reports he is going to try to go to sleep and went back to resting in assigned bed.    0600: Pt was  reported to have slept for 7 hours overnight with no signs of acute distress noted.

## 2024-05-21 DIAGNOSIS — F1020 Alcohol dependence, uncomplicated: Secondary | ICD-10-CM | POA: Insufficient documentation

## 2024-05-21 DIAGNOSIS — F1024 Alcohol dependence with alcohol-induced mood disorder: Secondary | ICD-10-CM

## 2024-05-21 DIAGNOSIS — I10 Essential (primary) hypertension: Secondary | ICD-10-CM

## 2024-05-21 DIAGNOSIS — F10239 Alcohol dependence with withdrawal, unspecified: Secondary | ICD-10-CM

## 2024-05-21 DIAGNOSIS — Z7984 Long term (current) use of oral hypoglycemic drugs: Secondary | ICD-10-CM

## 2024-05-21 DIAGNOSIS — G4700 Insomnia, unspecified: Secondary | ICD-10-CM

## 2024-05-21 DIAGNOSIS — F515 Nightmare disorder: Secondary | ICD-10-CM

## 2024-05-21 DIAGNOSIS — R197 Diarrhea, unspecified: Secondary | ICD-10-CM

## 2024-05-21 DIAGNOSIS — I4891 Unspecified atrial fibrillation: Secondary | ICD-10-CM

## 2024-05-21 DIAGNOSIS — E119 Type 2 diabetes mellitus without complications: Secondary | ICD-10-CM

## 2024-05-21 DIAGNOSIS — F32A Depression, unspecified: Secondary | ICD-10-CM | POA: Insufficient documentation

## 2024-05-21 DIAGNOSIS — I471 Supraventricular tachycardia, unspecified: Secondary | ICD-10-CM

## 2024-05-21 MED ORDER — GABAPENTIN 600 MG OR TABS
900.0000 mg | ORAL_TABLET | Freq: Three times a day (TID) | ORAL | Status: DC
Start: 2024-05-21 — End: 2024-05-22
  Administered 2024-05-21 – 2024-05-22 (×3): 900 mg via ORAL
  Filled 2024-05-21 (×3): qty 2

## 2024-05-21 MED ORDER — HYDROXYZINE HCL 25 MG OR TABS
50.0000 mg | ORAL_TABLET | Freq: Four times a day (QID) | ORAL | Status: DC | PRN
Start: 2024-05-21 — End: 2024-05-22
  Administered 2024-05-21 – 2024-05-22 (×2): 50 mg via ORAL
  Filled 2024-05-21 (×2): qty 2

## 2024-05-21 MED ORDER — PRAZOSIN HCL 2 MG OR CAPS
2.0000 mg | ORAL_CAPSULE | Freq: Every evening | ORAL | Status: DC
Start: 2024-05-21 — End: 2024-05-22
  Administered 2024-05-21: 2 mg via ORAL
  Filled 2024-05-21: qty 1

## 2024-05-21 MED ORDER — GABAPENTIN 600 MG OR TABS
900.0000 mg | ORAL_TABLET | Freq: Three times a day (TID) | ORAL | Status: DC
Start: 2024-05-21 — End: 2024-05-21

## 2024-05-21 NOTE — Discharge Instructions (Addendum)
 Diagnosis and Reason for Admission    You were admitted to the hospital for the following reason(s): You had thoughts to harm yourself.    Your full diagnosis list is located on this After Visit Summary in the Hospital Problems section.    What Happened During Your Hospital Stay    The main tests and treatments done for you during this hospitalization were:  Inpatient psychiatric care.    You have the following medical test/study results pending at discharge: None    The following evaluation is still important to complete after discharge from the hospital:  None    Instructions for After Discharge    Your diet at home should be a regular diet.    Your activity level at home should be:  regular activity.    Specific activity restrictions:    None    Other instructions:  None    Your medication list is located on this After Visit Summary in the Current Discharge Medication List section.  Your nurse will review this information with you before you leave the hospital.    It is very important for you to keep a current medication list with you in order to assist your doctors with your medical care.  Bring this After Visit Summary with you to your follow up appointments.    Reasons to Contact a Doctor Urgently    Call 911 or go to the nearest hospital Emergency Department immediately if:    - You develop suicidal ideation with intent and/or plan to harm yourself.   - You are struggling to care for yourself in the community due to your mental health symptoms.   - You develop homicidal ideation with intent and/or plan to harm someone else.   - You develop severe fever, confusion, muscle rigidity; this is a rare but serious side effect of antipsychotic medications.     You should contact your outpatient therapist or psychiatrist, the Emergency Screening Unit [phone]), or the Access and Crisis Line [phone]) for any of the following reasons:   - You develop thoughts to harm yourself, but have have not yet developed  intent or a plan for how you would do so.   - You are experiencing side effects of one of your prescribed medications.   - You develop thoughts to harm someone else, but have not yet developed intent or a plan for how you would do so.   - You run out of or misplace your prescribed medications.     If you have any questions about your hospital care, your pending test results,your medications, or if you have new or concerning symptoms soon after going home from the hospital, and you need to contact your hospital psychiatrist, your hospital psychiatrist can be contacted at the Neurobehavioral Medicine Unit [facility] / Devora Cater) at [phone].    Once you are able to see outpatient psychiatrist, he or she will then be responsible for further medication refills, or appointment referrals.    What Needs to Happen Next After Discharge -- Appointments and Follow Up    Any appointments already scheduled at [facility] will be listed in the Scheduled Appointments section at the top of this After Visit Summary.  Any appointments that have been requested, but have not yet been scheduled, will be listed below that under Post Discharge Referral.    Additional Information for You from your Case Manager and/or Social Worker (if applicable)  Next level of care referral: Patient referred to next level  of care: Your outpatient psychiatric follow up:  [facility]  [address] [place] [address]  Tel: [phone], FAX: [phone].   Your initial visit will be on a walk-in basis.   Walk-in hours are from 8:30 am to 3:30 pm Monday through Friday.     Substance abuse referral: Your next level of care provider will manage your addictions treatment     You were given a list of sobriety resources for 12 step support groups, rehab, and outpatient treatment services in [facility], and were encouraged by your social worker to utilize these resources to maintain sobriety.     Please utilize the [facility] [employer], [phone], 24 hours/7 days per week for psychiatric emergencies and referrals.    Medical Home Information  Your primary care provider or clinic currently on file at [facility] is: [facility]    If we are assigning you a new medical home for your follow up after discharge, that information will appear here:       Specific timing of follow-up:  within 1 week    Additional Discharge Information (if applicable)            Handouts Given to You (if applicable)

## 2024-05-21 NOTE — Interdisciplinary (Signed)
 [facility] Social Work Note:    Case discussed with MD and treatment team. Plan to discontinue CIWA protocol today. Anticipate readiness for crisis house referral tomorrow, pending continued clinical improvement and stabilization. Per treatment team, patient is in agreement with plan.

## 2024-05-21 NOTE — Progress Notes (Incomplete)
 PSYCHIATRY PROGRESS NOTE    ID: Christopher [name] is a 33 year old male with history of bipolar vs unipolar disorder, alcohol  use disorder, atrial fibrillation, diabetes mellitus admitted for alcohol  intoxication and agitation and reports of suicide attempt trying to run into traffic. Admitted on 5150 for DTS, expired 10/13 at 18:10. Pt signed voluntary on 10/13.      Target Symptoms: low mood, alcohol  cravings, alcohol  withdrawal    Interval Events:  Sleep: slept 7 hours  NAEON  Per nursing, He reports that he feels more comfortable now (less pain/anxiety) than he did during the initial vital signs check this shift. Pt reports he is going to try to go to sleep and went back to resting in assigned bed. Pt was reported to have slept for 7 hours overnight with no signs of acute distress noted.      Subjective:   This morning, patient was interviewed in the day room while he was reading a book. He says his sleep quality improved last night, but still is reporting recurrent nightmares. He says he is still worried about his withdrawal symptoms, particularly his anxiety and tremors.     He described continuation of his vivid nightmares last night, which he noted have been ongoing over the past 6 months. He wakes up frequently from these nightmares sweating and very anxious. He describes recurring nightmares that involve a man with no mouth that has to cut a whole in his face with a knife to breathe. Alcohol  has been the only thing that has helped him ignore these nightmares in the past.    He expressed that he does not want to be dependant on diazepam  and he is willing to try not taking it today, but he needs something to help him sleep.    Family, Psychiatric, or Social History Updates:   None. Family is still involved in his disposition planning, and expressed desire for outpatient alcohol  use program.     Scheduled Medications:   amLODIPine  5 mg Daily    folic acid   1 mg Daily    gabapentin  600 mg TID     lisinopril  20 mg Daily    metFORMIN  500 mg BID    multivitamin with minerals  1 tablet Daily    naltrexone  50 mg Daily    sertraline  150 mg Daily    thiamine   100 mg Daily    traZODone  50 mg HS       PRN Medications:   acetaminophen   650 mg Q4H PRN 650 mg at 05/20/24 2041    aluminum-magnesium-simethicone  30 mL Q6H PRN      diazePAM   10 mg Q6H PRN 10 mg at 05/20/24 2041    hydrOXYzine HCL  50 mg Q4H PRN 50 mg at 05/18/24 2040    loperamide  2 mg Q6H PRN      LORazepam  1 mg Q4H PRN      magnesium hydroxide  30 mL Nightly PRN      OLANZapine  10 mg Q4H PRN         Review of Systems:   Denies pain,  headache, nausea, vomiting, diarrhea, hematuria, dysuria, vision changes or difficulty ambulating.   He reports mild tremor and occasional pruritus.     Objective:   05/20/24  0800 05/20/24  2000 05/20/24  2120 05/21/24  0800   BP: (!) 143/93 (!) 131/105 141/90 140/89   Pulse: 70 89 66 62  Temp: 98.3 F (36.8 C) 98 F (36.7 C) 98.1 F (36.7 C) 97.9 F (36.6 C)   Resp: 18 18 18 18    SpO2: 100%   100%     Pain: Pain Score: Patient Sleeping, Respiratory Assessment Done  Night Shift Hours of Sleep: 7 hrs.  Administered PRN meds in the last 24 hours (or last weekend):   Dose: Diazepam  10 mg  Time: 10/14  1615 10/15 0921      Mental Status Examination:    Appearance: Appears stated age, sitting in dayroom eating breakfast.   Behavior: Calm, cooperative, good eye contact.  Motor / abnormal involuntary movements: No psychomotor agitation or retardation. No tics. Mild bilateral hand tremors when arms are outstretched   Gait: Steady, independent   Speech: Normal rate, volume and rhythm.  Mood: I'm doing good  Affect: Congruent, dysphoric  Thought process: coherent, logical  Associations: linear, goal-directed, and over-inclusive  Thought content: Help seeking, remorseful, no current SI or HI.   Perceptions: no abnl.   Insight / judgment: Fair/fair, understands need for treatment and willing to accept treatment,  despite history of repeated hospitalization for substance use   Sensorium / orientation / intellectual functions: AOx4    New Labs/Studies:   None    Narrative Assessment:   Christopher [name] is a 33 year old male with history of bipolar vs unipolar disorder, alcohol  use disorder, atrial fibrillation, diabetes mellitus admitted for alcohol  intoxication and agitation and reports of suicide attempt trying to run into traffic.    05/20/2024  Patient is looking markedly improved this morning, although he is still receiving PRN diazepam  1-2 times daily. His sleep is improving, and he has steadily been engaging more with daytime activities. He has been communicating with family more over the phone recently, and they have expressed desire to help him upon discharge. Current discharge preference is going to a crisis house, and then to apartment organized by mother. Family also has preference for [facility] outpatient treatment program. Patient continues to require inpatient psychiatric hospitalization for diagnostic evaluation, treatment planning, medication planning, and safe disposition planning.     05/21/2024   Patient looks to have improved energy and motivation, but continues to require diazepam  x 2 daily for his alcohol  withdrawal symptoms. CIWA intermittently spikes above 8, largely driven by mild tremor, anxiety and occasional pruritus. Barrier to discharge is largely driven by continued reliance on benzodiazepines for management of withdrawal symptoms. Plan to begin trial without benzodiazepines/CIWA, and increase gabapentin  to 900 mg TID. Current dispo plan is crisis house or apartment set up by mother and long term follow up with an outpatient alcohol  use program.     A comprehensive suicide risk assessment was performed and the patient was assessed to be at a intermediate acute risk of self-harm due to the following factors:  - Suicidal ideation: Denies    - Plan: Denies  - Intent: Denies  -  Suicidal/Self Harm Behaviors: Patient could not or would not answer  - Modifiable risk factors: precipitating stressors and intoxication  - Non-modifiable risk factors: previous suicide attempts, male gender, and single status  - Protective factors: coping skills and therapeutic relationships    A violence risk assessment was also performed. The following behavioral risk factors are associated with acute violence risk and have been present within the past 24 hours: irritability. Based on these factors, this patient's acute risk of violence in the inpatient setting in the next 24 hours is assessed to  be low. Historical (non-modifiable) risk factors for violence in this patient include: prior violent acts.    Active Problem List:  #Mood Disorder, Unspecified   #r/o MDD vs substance induced mood disorder   #Alcohol  Use Disorder   #Insomnia    Plan:  A. Psychiatric:   # Mood disorder, unspecified  - Increase from Sertraline to 150 mg PO daily to target depressed mood  - Psychology consulted    # Alcohol  Use Disorder  # Substance induced mood d/o  - Naltrexone 50 mg PO daily to target alcohol  cravings  - thiamine  100mg  PO daily   - [facility] substance use clinic  - Behavioral monitoring q19m    # Recurrent Nightmares  # Insomnia  - add prazosin 2mg  nightly for nightmare  - Trazodone 50 mg nightly for insomnia     B. Medical:  # Alcohol  Withdrawal, improving  # Diarrhea, improving  - discontinue CIWA protocol  - increase gabapentin 900 mg PO TID to manage withdrawal symptoms  - discontinue diazepam  10mg  PO q6h PRN for breakthrough withdrawal symptoms   - loperamide 2mg  PO q6h PRN diarrhea    # HTN  - Continue Lisinopril 20mg  PO daily  - Discontinue Amlodipine 5mg  PO daily for elevated BPs  - CTM BP - SBP continues to be mildly elevated in the 150s and DBP to 90s    # DM2  - Blood Glucose > 200 on two tests, A1C 7.4  - Metformin 500mg  PO BID     # Afib, SVT - ?Paroxysmal  - Previously on Apixiban 5mg  PO daily, unclear if taking  it as pt can't remember all his medications  - EKG was normal on 10/13  - Consider Cards consult for further elucidation     Audit-C Score: 11  4 or more is considered unhealthy alcohol  use in men.    The Centers for Disease Control and Prevention and [employer] on Aging are used as references to provide feedback to the patient regarding his/her alcohol  use in comparison to national norms.    http://www.cdc.gov/alcohol /fact-sheets/alcohol -use.htm   https://www.nia.nih.gov/health/publication/alcohol -use-older-people    Patient engaged in a brief intervention for alcohol  use. The patient engaged in a brief intervention that included the following components:    Feedback concerning the quantity and frequency of alcohol  consumed in comparison to national norms,  Discussion of the possible negative physical, emotional, social and occupational consequences of significant alcohol  use,  Discussion of the overall severity of patient's alcohol  use.    Based on this discussion patient is deemed to be in the contemplative stage of change regarding their alcohol  use.    Patient was engaged in a joint decision making process regarding their alcohol  use and plans for follow-up. Patient accepted referral for the following referrals: residential rehabilitation programs and sober living facilities.  C.  Psychosocial: per SW note.  D.  Legal: vol, previously on 5150 for DTS starting on 10/10  E.  Disposition: to be determined. likely substance use program    Days to Complete Medical Plan of Care Requiring Ongoing Hospitalization: 2 days out --->Care Needs: Placement to Acohol Use Program.                  (Discharge Planning and EDD Info)    This patient and his care were discussed with the attending psychiatrist, Dr. Suzanna.    Written with the help of Serene Childs, MS3    Hoy Seltzer, MD  [facility], PGY1

## 2024-05-21 NOTE — Utilization Review (Signed)
==========  Authorization Summary====================  -Primary Insurance/Health Plan Name:Payor: MEDI-CAL / Plan: [place] [employer] [facility] / Product Type: Medicaid Managed Care /   -BH is authorized by [employer] and HB claim should go to Med[employer].  Do not send claim to [facility]  -Authorization/Reference/Tracking Number: [id number] per [employer]  -Secondary Insurance: None  - Physician admit order: INPATIENT 05/16/2024 @ 1632  -Utilization review: Currie NOVAK., RN PH: [phone] FAX: [phone]  =============Running Summary============================  05/16/2024/Inpatient/Acute -05/20/2024  -------------------------------------------------------------------------------------     CONCURRENT REVIEW 05/21/24:

## 2024-05-21 NOTE — Plan of Care (Signed)
 Problem: Promotion of Mental Health and Safety  Goal: The pt remains safe, receives appropriate treatment and achieves outcomes (psychologically, psychosocially, physically, and spiritually) within the limitations of the disease process by discharge.  Outcome: Progressing  Flowsheets  Taken 05/21/2024 2153  Guidelines:   Inpatient Nursing Guidelines   Mental Health Assessment   Mood disorder   Addiction, Chemical Dependence and Withdrawal  Individualized Interventions/Recommendations #1: encourage verbalization of thoughts, feelings, and symptoms through 1:1 nursing therapeutic communication to assist with needs  Individualized Interventions/Recommendations #2: educate on importance of continued medication compliance to target depression  Individualized Interventions/Recommendations #3: promote sleep hygiene through a structured and therapeutic milieu  Taken 05/21/2024 2050  Patient /Family stated Goal: hopefully leave soon so I can celebrate my mom's birthday with her  Note: Patient received at the start of the shift in the dayroom using unit phone. Patient reported his mood is okay after finishing up his phone call. Patient stated he was speaking with his mother and hopes he can leave this weekend to celebrate his mother's birthday. Patient denied SI/HI, AVH, paranoia, and depression. He did report some anxiety about next steps. Patient stated he has a place to stay after he leaves and wants to join [employer] He discussed feeling embarrassed about his drinking and motivated to prove the family members wrong that don't believe he can stay sober. Patient stated his withdrawal symptoms have overall improved. He showed writer that his hands are slightly tremulous when extended. He told Clinical research associate that he does not want to use any benzos because he wants to leave and feels ready for discharge. Patient compliant with hs scheduled medications. Patient cooperative and friendly throughout interactions.     He requested  PRN Tylenol  650 mg for R leg pain he rated 7/10 and PRN Atarax 50 mg for anxiety he rated 6/10 at 2200, both with good effect. He retired to bed after for sleep. VS stable and no flu like symptoms.     Patient appears to have slept 7 hours throughout the night. Respirations appeared equal and unlabored with no respiratory distress noted throughout the night. Remains on q10 minute safety checks. No behavioral issues throughout the shift.  Will continue to monitor patient behavior and offer nursing support as needed.

## 2024-05-21 NOTE — Discharge Summary (Signed)
 Psychiatry Service Discharge Summary    Date of Admission:  05/14/2024  Date of Discharge:  05/22/2024    Hospital Problem List (required):  Active Hospital Problems    Diagnosis    *Depression, unspecified [F32.A]    Alcohol  use disorder, severe, dependence (CMS-HCC) [F10.20]      Resolved Hospital Problems   No resolved problems to display.       Additional Hospital Diagnoses (rule out or suspected diagnoses, etc.):  None    Principal Procedure During This Hospitalization (required):  None    Other Procedures Performed During This Hospitalization (required):  None    Procedure results are available in Chart Review in Epic.  For those providers external to [facility], the key procedure results are listed below:  None    Consultations Obtained During This Hospitalization:  None    Key consultant recommendations:  NA    Reason for Admission to the Hospital:  Christopher [name] is a 33 year old male with history of bipolar disorder, alcohol  use disorder, atrial fibrillation, diabetes mellitus admitted for alcohol  intoxication and agitation.     The patient states that he can not remember what happened yesterday.  He knows that he was drinking and eventually got picked up by police being agitated. He drinks regularly hard liquor. He knows that he has an alcohol  problem but he does not see a purpose in going to a rehab facility. Is in the process of trying to find a job and has been applying to remote jobs at [employer] and other institution but has not been successful so far. Things that by getting a job he would be able to stop drinking. He has been homeless but is currently living in an apartment that belongs to his mother. He denies suicidal or homicidal ideations and denies auditory or visual hallucinations.     Collateral information (mother Dedra, [phone]): The patient has been very ill for many months. He has been erratic and paranoid. He has been endorsing that he is part of a gang, which is not true. He  has been acting differently and threatening to family members. He is driving recklessly and went to his grandmother, threatening her also. He has been having many episodes of suicidal threats. She is very scared that he will hurt someone. He acts grandiose and has no sense of reality. She knows that something really bad will happen.     Per interview with pt upon transfer to [facility]:     Christopher [name] was calm and cooperative during the interview, expressing remorse for what happened and hoping to get help going forward saying I can afford to have something like that happen again. He says that he feels well otherwise, maybe a little depressed but otherwise is goal/future oriented and fully engaged with seeking treatment. He denies any SI/HI/AVH.    Hospital Course by Problem:    A. Psychiatric:   Upon arrival to unit, patient was observed and demonstrated to be dysphoric with significant withdrawal symptoms (tremors, diarrhea, tactile hallucinations, visual hallucinations) and his CIWA scored ranged from 11 - 8. He also demonstrated low mood and meet criteria for MDD. Patient was started on sertraline 50mg  for low mood titrated to 150mg . Patient was started on naltrexone 50mg  for alcohol  cravings. Patient was started on gabapentin 300mg  TID for anxiety and withdrawal, titrated up to 900mg  TID. Patient was started on Trazodone 50mg  for sleep in the setting of insomnia. Patient was started on prazosin 2mg  for nightmares.  Patient was also provided with PRN Diazepam  for CIWA>8.  Patient was compliant with medications and reported no side effects.    Over the course of hospitalization, patient became less dysphoric, discussed a desire to receive substance use treatment services, connect with AA, discuss discharge planning with his family, coordinated to get primary care and a find an apartment with his mother's help. He open up about his significant feeling of depression for years and using alcohol  as a means to cope  with his poor sleep and nightmares. He showed improvement on the medications, is reporting better sleep and an improved mood.     On day of discharge, patient was visible on unit, appropriate with staff, and was encouraged to participate in individual, group, occupational, and milieu therapies. Patient denied suicidal ideation, homicidal ideation, or audio/visual hallucinations. Patient was reported to be eating and sleeping well. We discussed the importance of medication adherence and staying sober as it would reduce the risk of more impulsive behaviors and rehospitalizations. Mother was called prior to discharge and was comfortable with patient's progress for discharge.    Patient is at low acute risk for imminent suicide has been interacting appropriately with peers and staff, and is future oriented, discussing plans for follow-up with outpatient services and future goals. The patient has a reasonable support system and plan of care, and denies access to a firearm. Patient was discharged in stable condition to prior residence. Patient was advised to follow up with their outpatient psychiatrist regularly, take medication as prescribed, and avoid drug use and alcohol  consumption for optimum mental health. Patient states they are willing and able to seek help or return to the nearest ER should their condition worsen.       Audit-C Score: 11  4 or more is considered unhealthy alcohol  use in men.    The Centers for Disease Control and Prevention and [employer] on Aging are used as references to provide feedback to the patient regarding his/her alcohol  use in comparison to national norms.    http://www.cdc.gov/alcohol /fact-sheets/alcohol -use.htm   https://www.nia.nih.gov/health/publication/alcohol -use-older-people    Patient engaged in a brief intervention for alcohol  use. The patient engaged in a brief intervention that included the following components:    Feedback concerning the quantity and frequency of  alcohol  consumed in comparison to national norms,  Discussion of the possible negative physical, emotional, social and occupational consequences of significant alcohol  use,  Discussion of the overall severity of patient's alcohol  use.    Based on this discussion patient is deemed to be in the contemplative stage of change regarding their alcohol  use.    Patient was engaged in a joint decision making process regarding their alcohol  use and plans for follow-up. Patient accepted referral for the following referrals: Alcoholics Anonymous.    B. Medical:   # Alcohol  Withdrawal, resolved    # Diarrhea, resolved    - continue gabapentin 900 mg PO TID to manage withdrawal symptoms  - loperamide 2mg  PO q6h PRN diarrhea     # HTN  Patient was intermittently hypertensive on the unit. Suspect this was related to his alcohol  withdrawal.   - Continue Lisinopril 20mg  PO daily  - CTM BP - SBP continues to be mildly elevated in the 150s and DBP to 90s     # DM2  - Blood Glucose > 200 on two tests, A1C 7.4  - Metformin 500mg  PO BID     # Afib, SVT - ?Paroxysmal  - Previously on Apixiban 5mg  PO daily, unclear  if taking it as pt can't remember all his medications  - EKG was normal on 10/13  - Follow up with PCP outpatient regarding this    C. Legal: vol, previously on 5150 for DTS starting on 10/10     Comprehensive Risk Assessments at Discharge:  A comprehensive suicide risk assessment was performed and the patient was assessed to be at a low acute risk of self-harm due to the following factors:  - Suicidal ideation: Denies   - Plan: Denies  - Intent: Denies  - Suicidal/Self Harm Behaviors: Denies  - Modifiable risk factors: precipitating stressors and intoxication  - Non-modifiable risk factors: previous suicide attempts, existing psychiatric diagnoses, male gender, and single status  - Protective factors: future life plans, therapeutic relationships, and access to health care    A violence risk assessment was also performed. The  following behavioral risk factors are associated with acute violence risk and have been present within the past 24 hours: none. Based on these factors, this patient's acute risk of violence in the inpatient setting in the next 24 hours is assessed to be low. Historical (non-modifiable) risk factors for violence in this patient include: prior violent acts.      Tests Outstanding at Discharge Requiring Follow Up:  None    Discharge Condition (required):  Stable.    Mental Status Examination at Discharge:    Appearance: age appearing male, sitting in dayroom watching TV, fair grooming/hygiene, wearing casual clothing  Behavior: calm, cooperative, good eye contact  Motor / abnormal involuntary movements: mild bilateral hand tremors when hands outstretched, no PMR/PMA  Gait: steady, independent  Speech: normal rate, volume, tone  Mood: so much better  Affect: euthymic  Thought process: coherent, logical  Associations: linear and goal-directed  Thought content: denies SI/HI, help seeking  Perceptions: no abnl  Insight / judgment: fair/fair  Sensorium: Level of consciousness awake; attentive; recent memory intact; remote memory intact  Orientation: Patient is oriented to person, place, time, and situation  Intellectual Functions: Fund of knowledge average based on grammar and vocabulary; Interpretations abstract    Clinical Global Impression - Severity: 1= Normal, not at all ill     Key Physical Exam Findings at Discharge:  No significant physical examination findings at the time of discharge.    Discharge Diet:  Regular.    Discharge Disposition:  Home.        Discharge Code Status:  Full code / full care  This code status is not changed from the time of admission.    Discharge Medications:     What To Do With Your Medications        START taking these medications        Add'l Info   folic acid  1 MG tablet  Commonly known as: FOLVITE   Take 1 tablet (1 mg) by mouth daily for 5 doses.  Start taking on: May 23, 2024    Quantity: 5 tablet  Refills: 0     gabapentin 600 MG tablet  Commonly known as: NEURONTIN  Take 1.5 tablets (900 mg) by mouth 3 times daily after meals.   Quantity: 135 tablet  Refills: 0     lisinopril 20 MG tablet  Commonly known as: PRINIVIL, ZESTRIL  Take 1 tablet (20 mg) by mouth daily.  Start taking on: May 23, 2024   Quantity: 30 tablet  Refills: 0     metFORMIN 500 mg XR tablet  Commonly known as: GLUCOPHAGE XR  Take 1 tablet (500 mg)  by mouth 2 times daily.   Quantity: 60 tablet  Refills: 0     naltrexone 50 MG tablet  Commonly known as: DEPADE  Take 1 tablet (50 mg) by mouth daily.  Start taking on: May 23, 2024   Quantity: 30 tablet  Refills: 0     prazosin 2 MG capsule  Commonly known as: MINIPRESS  Take 1 capsule (2 mg) by mouth at bedtime.   Quantity: 30 capsule  Refills: 0     sertraline 50 MG tablet  Commonly known as: ZOLOFT  Take 3 tablets (150 mg) by mouth daily.  Start taking on: May 23, 2024   Quantity: 30 tablet  Refills: 0     thiamine  100 MG tablet  Commonly known as: VITAMIN B1  Take 1 tablet (100 mg) by mouth daily.  Start taking on: May 23, 2024   Quantity: 30 tablet  Refills: 0     traZODone 50 MG tablet  Commonly known as: DESYREL  Take 1 tablet (50 mg) by mouth at bedtime.   Quantity: 30 tablet  Refills: 0               Where to Get Your Medications        These medications were sent to [facility]  [address], [place] [address]      Hours: Mon-Fri: 8:30am-7:00pm; Sat-Sun & Holidays: 9:00am-5:00pm Phone: [phone]   folic acid  1 MG tablet  gabapentin 600 MG tablet  lisinopril 20 MG tablet  metFORMIN 500 mg XR tablet  naltrexone 50 MG tablet  prazosin 2 MG capsule  sertraline 50 MG tablet  thiamine  100 MG tablet  traZODone 50 MG tablet           Patient Discharging with >1 antipsychotic, including PRN antipsychotics: No    Allergies:  Allergies[1]    Follow Up Appointments:    [facility]  [address] [place] [address]  Tel: [phone], FAX: [phone].   Your initial visit will be on a walk-in basis.   Walk-in hours are from 8:30 am to 3:30 pm Monday through Friday.     Discharging Physician's Contact Information:  [facility] ([facility] / [facility]) at [phone].    Psychiatry Attending Addendum:  I have discussed the case with the resident physician and have personally examined the patient. I agree with the information documented in the resident physician discharge summary. I spent greater than 30 minutes in day-of-discharge management, including examining the patient, engaging in medical decision-making, and coordinating the follow-up care plan.    Gaynel Sherlynn Shields, MD  Attending Psychiatrist         [1] No Known Allergies

## 2024-05-21 NOTE — Plan of Care (Signed)
 Problem: Promotion of Mental Health and Safety  Goal: The pt remains safe, receives appropriate treatment and achieves outcomes (psychologically, psychosocially, physically, and spiritually) within the limitations of the disease process by discharge.  Flowsheets  Taken 05/21/2024 1123 by Tyneshia Stivers, RN  Outcome Evaluation (rationale for progressing/not progessing) every shift: Please see note.  Individualized Interventions/Recommendations #1: Encourage patient to verbalize feeilngs and needs  Individualized Interventions/Recommendations #2: Encourage patient to actively participate in unit activities and groups  Individualized Interventions/Recommendations #4: Encourage patient to maintain good personal hygiene  Individualized Interventions/Recommendations #5: Provide reality orientation, redirection and reassurances as needed  Taken 05/21/2024 0828 by Jameek Bruntz, RN  Patient /Family stated Goal: I want to talk to my doctors.  Taken 05/20/2024 2215 by Arlyss Hard, RN  Individualized Interventions/Recommendations #3: Promote medication compliance to target symptoms and assess the need for PRNs.  Taken 05/20/2024 0117 by Janne Lonni Rush, RN  Guidelines:   Inpatient Nursing Guidelines   Mental Health Assessment   Mood disorder   Addiction, Chemical Dependence and Withdrawal  Note: Received patient in the dayroom reading a book.  Christopher [name] is appropriately dressed and adequately groomed.  Patient is pleasant and cooperative when engaged in conversation.  Patient is medication compliant and continues to eat meals with good appetite.  Adolphe denies SI, HI, AVH at this time.  Patient remains social and was observed playing ping pong with peers.  Patient denies any withdrawal symptoms at this time.  Christopher [name] remains focused on discharge plans stating, I'd like to be home in time for my mom's birthday.  Patient discussed his discharge plans w/SW and MD.  No c/o any pain, distress or discomfort at  this time.

## 2024-05-21 NOTE — Progress Notes (Signed)
 PSYCHIATRY PROGRESS NOTE    ID: Christopher [name] is a 33 year old male with history of bipolar vs unipolar disorder, alcohol  use disorder, atrial fibrillation, diabetes mellitus admitted for alcohol  intoxication and agitation and reports of suicide attempt trying to run into traffic. Admitted on 5150 for DTS, expired 10/13 at 18:10. Pt signed voluntary on 10/13.      Target Symptoms: low mood, alcohol  cravings, alcohol  withdrawal    Interval Events:  Sleep: slept 7 hours  NAEON  Per nursing, He reports that he feels more comfortable now (less pain/anxiety) than he did during the initial vital signs check this shift. Pt reports he is going to try to go to sleep and went back to resting in assigned bed. Pt was reported to have slept for 7 hours overnight with no signs of acute distress noted.      Subjective:   This morning, patient was interviewed in the day room while he was reading a book. He says his sleep quality improved last night, but still is reporting recurrent nightmares. He says he is still worried about his withdrawal symptoms, particularly his anxiety and tremors.     He described continuation of his vivid nightmares last night, which he noted have been ongoing over the past 6 months. He wakes up frequently from these nightmares sweating and very anxious. He describes recurring nightmares that involve a man with no mouth that has to cut a whole in his face with a knife to breathe. Alcohol  has been the only thing that has helped him ignore these nightmares in the past.    He expressed that he does not want to be dependant on diazepam  and he is willing to try not taking it today, but he needs something to help him sleep.    Feeling okay. Agrees with trying off benzodiazepines today. Anxious about the idea of a crisis house, working with his mom to get a place to stay. Denies SI/HI/AVH. Wants to continue to engage in outpatient substance use disorder treatment and psychiatric care.  He  denies side effects of current medications.  No new physical concerns elicited.    Family, Psychiatric, or Social History Updates:   None. Family is still involved in his disposition planning, and expressed desire for outpatient alcohol  use program. Patient's mother was called but she did not answer.    Scheduled Medications:   folic acid   1 mg Daily    gabapentin  900 mg TID PC    lisinopril  20 mg Daily    metFORMIN  500 mg BID    multivitamin with minerals  1 tablet Daily    naltrexone  50 mg Daily    prazosin  2 mg HS    sertraline  150 mg Daily    thiamine   100 mg Daily    traZODone  50 mg HS       PRN Medications:   acetaminophen   650 mg Q4H PRN 650 mg at 05/20/24 2041    aluminum-magnesium-simethicone  30 mL Q6H PRN      hydrOXYzine HCL  50 mg Q4H PRN 50 mg at 05/18/24 2040    loperamide  2 mg Q6H PRN      magnesium hydroxide  30 mL Nightly PRN      OLANZapine  10 mg Q4H PRN         Review of Systems:   Denies pain,  headache, nausea, vomiting, diarrhea, hematuria, dysuria, vision changes or difficulty ambulating.  He reports mild tremor and occasional pruritus.     Objective:   05/20/24  0800 05/20/24  2000 05/20/24  2120 05/21/24  0800   BP: (!) 143/93 (!) 131/105 141/90 140/89   Pulse: 70 89 66 62   Temp: 98.3 F (36.8 C) 98 F (36.7 C) 98.1 F (36.7 C) 97.9 F (36.6 C)   Resp: 18 18 18 18    SpO2: 100%   100%     Pain: Pain Score: Patient Sleeping, Respiratory Assessment Done  Night Shift Hours of Sleep: 7 hrs.  Administered PRN meds in the last 24 hours (or last weekend):   Dose: Diazepam  10 mg  Time: 10/14  1615 10/15 0921      Mental Status Examination:    Appearance: Appears stated age, well developed male sitting in dayroom eating breakfast.   Behavior: Calm, cooperative, good eye contact.  Motor / abnormal involuntary movements: No psychomotor agitation or retardation. No tics. Mild bilateral hand tremors when arms are outstretched   Gait: Steady, independent   Speech: Normal rate, volume and  rhythm.  Mood: I'm doing good  Affect: Congruent, dysphoric, constricted   Thought process: coherent, logical  Associations: linear, goal-directed, and over-inclusive  Thought content: Help seeking, remorseful, no current SI or HI.   Perceptions: no abnl.   Insight / judgment: Fair/fair, understands need for treatment and willing to accept treatment, despite history of repeated hospitalization for substance use   Sensorium / orientation / intellectual functions: AOx4    New Labs/Studies:   None    Narrative Assessment:   Christopher [name] is a 33 year old male with history of bipolar vs unipolar disorder, alcohol  use disorder, atrial fibrillation, diabetes mellitus admitted for alcohol  intoxication and agitation and reports of suicide attempt trying to run into traffic.    05/21/2024   Patient looks to have improved energy and motivation, but continues to require diazepam  x 2 daily for his alcohol  withdrawal symptoms. CIWA intermittently spikes above 8, largely driven by mild tremor, anxiety and occasional pruritus. Barrier to discharge is largely driven by continued reliance on benzodiazepines for management of withdrawal symptoms. Plan to begin trial without benzodiazepines/CIWA, and increase gabapentin to 900 mg TID. Current dispo plan is crisis house or apartment set up by mother and long term follow up with an outpatient alcohol  use program. Patient continues to require inpatient psychiatric hospitalization for diagnostic evaluation, treatment planning, medication planning, and safe disposition planning.     A comprehensive suicide risk assessment was performed and the patient was assessed to be at a intermediate acute risk of self-harm due to the following factors:  - Suicidal ideation: Denies    - Plan: Denies  - Intent: Denies  - Suicidal/Self Harm Behaviors: Patient could not or would not answer  - Modifiable risk factors: precipitating stressors and intoxication  - Non-modifiable risk factors:  previous suicide attempts, male gender, and single status  - Protective factors: coping skills and therapeutic relationships    A violence risk assessment was also performed. The following behavioral risk factors are associated with acute violence risk and have been present within the past 24 hours: irritability. Based on these factors, this patient's acute risk of violence in the inpatient setting in the next 24 hours is assessed to be low. Historical (non-modifiable) risk factors for violence in this patient include: prior violent acts.    Active Problem List:  #Mood Disorder, Unspecified   #r/o MDD vs substance induced mood disorder   #  Alcohol  Use Disorder   #Insomnia    Plan:  A. Psychiatric:   # Mood disorder, unspecified  - Continue Sertraline 150 mg PO daily to target depressed mood    # Alcohol  Use Disorder  # Substance induced mood d/o  - Naltrexone 50 mg PO daily to target alcohol  cravings  - thiamine  100mg  PO daily   - [facility]  - Behavioral monitoring q44m    # Recurrent Nightmares  # Insomnia  - add prazosin 2mg  nightly for nightmare  - Trazodone 50 mg nightly for insomnia     B. Medical:  # Alcohol  Withdrawal, improving  # Diarrhea, improving  - discontinue CIWA protocol  - increase gabapentin 900 mg PO TID to manage withdrawal symptoms  - discontinue diazepam  10mg  PO q6h PRN for breakthrough withdrawal symptoms   - loperamide 2mg  PO q6h PRN diarrhea    # HTN  - Continue Lisinopril 20mg  PO daily  - Discontinue Amlodipine 5mg  PO daily for elevated BPs  - CTM BP - SBP continues to be mildly elevated in the 150s and DBP to 90s    # DM2  - Blood Glucose > 200 on two tests, A1C 7.4  - Metformin 500mg  PO BID     # Afib, SVT - ?Paroxysmal  - Previously on Apixiban 5mg  PO daily, unclear if taking it as pt can't remember all his medications  - EKG was normal on 10/13  - Consider Cards consult for further elucidation     Audit-C Score: 11  4 or more is considered unhealthy alcohol  use in  men.    The Centers for Disease Control and Prevention and [employer] on Aging are used as references to provide feedback to the patient regarding his/her alcohol  use in comparison to national norms.    http://www.cdc.gov/alcohol /fact-sheets/alcohol -use.htm   https://www.nia.nih.gov/health/publication/alcohol -use-older-people    Patient engaged in a brief intervention for alcohol  use. The patient engaged in a brief intervention that included the following components:    Feedback concerning the quantity and frequency of alcohol  consumed in comparison to national norms,  Discussion of the possible negative physical, emotional, social and occupational consequences of significant alcohol  use,  Discussion of the overall severity of patient's alcohol  use.    Based on this discussion patient is deemed to be in the contemplative stage of change regarding their alcohol  use.    Patient was engaged in a joint decision making process regarding their alcohol  use and plans for follow-up. Patient accepted referral for the following referrals: residential rehabilitation programs and sober living facilities.  C.  Psychosocial: per SW note.  D.  Legal: vol, previously on 5150 for DTS starting on 10/10  E.  Disposition: to be determined. likely substance use program    Days to Complete Medical Plan of Care Requiring Ongoing Hospitalization: 2 days out --->Care Needs: Placement to [facility] Use Program.                  (Discharge Planning and EDD Info)    This patient and his care were discussed with the attending psychiatrist, Dr. Suzanna.    Written with the help of Serene Childs, MS3    Hoy Seltzer, MD  [facility], PGY1    Attending Psychiatrist Addendum 05/21/24    I have seen and examined the patient and reviewed the history, presentation, progress, and diagnosis with the resident physician. I agree with the findings and plan as documented with additional comments made below, and I participated in the  medical  decision making with the resident physician. My edits are made in underlined/italicized text.     The patient is appropriate for continued psychiatric hospitalization for safety, diagnostic clarification, medication management, stabilization, and safe dispo planning.    Gaynel Sherlynn Shields, MD  Attending Psychiatrist

## 2024-05-22 ENCOUNTER — Other Ambulatory Visit: Payer: Self-pay

## 2024-05-22 DIAGNOSIS — F32A Depression, unspecified: Secondary | ICD-10-CM

## 2024-05-22 MED ORDER — THIAMINE HCL 100 MG OR TABS (CUSTOM)
100.0000 mg | ORAL_TABLET | Freq: Every day | ORAL | 0 refills | Status: AC
Start: 2024-05-23 — End: ?
  Filled 2024-05-22: qty 30, 30d supply, fill #0

## 2024-05-22 MED ORDER — TRAZODONE HCL 50 MG OR TABS
50.0000 mg | ORAL_TABLET | Freq: Every evening | ORAL | 0 refills | Status: AC
Start: 2024-05-22 — End: ?
  Filled 2024-05-22: qty 30, 30d supply, fill #0

## 2024-05-22 MED ORDER — FOLIC ACID 1 MG OR TABS
1.0000 mg | ORAL_TABLET | Freq: Every day | ORAL | 0 refills | Status: AC
Start: 2024-05-23 — End: 2024-05-28
  Filled 2024-05-22: qty 5, 5d supply, fill #0

## 2024-05-22 MED ORDER — METFORMIN HCL 500 MG OR TB24
500.0000 mg | ORAL_TABLET | Freq: Two times a day (BID) | ORAL | 0 refills | Status: DC
Start: 2024-05-22 — End: 2024-06-30
  Filled 2024-05-22: qty 60, 30d supply, fill #0

## 2024-05-22 MED ORDER — PRAZOSIN HCL 2 MG OR CAPS
2.0000 mg | ORAL_CAPSULE | Freq: Every evening | ORAL | 0 refills | Status: AC
Start: 2024-05-22 — End: ?
  Filled 2024-05-22: qty 30, 30d supply, fill #0

## 2024-05-22 MED ORDER — GABAPENTIN 600 MG OR TABS
900.0000 mg | ORAL_TABLET | Freq: Three times a day (TID) | ORAL | 0 refills | Status: AC
Start: 2024-05-22 — End: ?
  Filled 2024-05-22: qty 135, 30d supply, fill #0

## 2024-05-22 MED ORDER — SERTRALINE HCL 50 MG OR TABS
150.0000 mg | ORAL_TABLET | Freq: Every day | ORAL | 0 refills | Status: AC
Start: 2024-05-23 — End: ?
  Filled 2024-05-22: qty 49, 16d supply, fill #0
  Filled 2024-05-23 – 2024-05-24 (×2): qty 41, 13d supply, fill #1
  Filled 2024-06-02: qty 49, 16d supply, fill #1
  Filled 2024-06-02: qty 41, 13d supply, fill #1
  Filled 2024-06-03: qty 49, 16d supply, fill #1

## 2024-05-22 MED ORDER — LISINOPRIL 20 MG OR TABS
20.0000 mg | ORAL_TABLET | Freq: Every day | ORAL | 0 refills | Status: AC
Start: 2024-05-23 — End: ?
  Filled 2024-05-22: qty 30, 30d supply, fill #0

## 2024-05-22 MED ORDER — NALTREXONE HCL 50 MG OR TABS
50.0000 mg | ORAL_TABLET | Freq: Every day | ORAL | 0 refills | Status: AC
Start: 2024-05-23 — End: ?
  Filled 2024-05-22: qty 30, 30d supply, fill #0

## 2024-05-22 NOTE — Interdisciplinary (Signed)
 Discharged to home with bus pass at 1140, accompanied by staff off unit. Continuing care plan, list of medications, personal belongings, and after visit summary (AVS) given to patient at time of discharge. Vital signs are Blood pressure 147/88, pulse 60, temperature 97.8 F (36.6 C), resp. rate 18, weight 122.5 kg (270 lb), SpO2 100%.; pain level 0; skin condition WDL.    At time of discharge patient future focused, goal oriented, A&Ox4, independently ambulatory, breathing equally and unlabored. Patient denied si/hi/avh and paranoia. Patient verbalized understanding and provided teach-back on all discharge education provided by primary RN regarding AVS, medications, follow-up appointments and community resources.

## 2024-05-23 ENCOUNTER — Other Ambulatory Visit: Payer: Self-pay

## 2024-05-24 ENCOUNTER — Other Ambulatory Visit: Payer: Self-pay

## 2024-05-24 ENCOUNTER — Telehealth (HOSPITAL_COMMUNITY): Payer: Self-pay

## 2024-05-24 NOTE — Telephone Encounter (Signed)
 [facility] Social Work Note:    Patient's nurse advised them of the firearms prohibition period with "Patient Notification of Firearm Prohibition and Right to Hearing Form" (BOF 4009B) at discharge. SW faxed completed form to the [place] [employer], [employer] [facility] Firearms- Mental Health Unit, Fax: [phone]/ Alternate fax: [phone]

## 2024-05-24 NOTE — Utilization Review (Signed)
 ==========Authorization Summary====================  -Primary Insurance/Health Plan Name:Payor: MEDI-CAL / Plan: [place] [employer] [facility] / Product Type: Medicaid Managed Care /   -BH is authorized by [employer] and HB claim should go to Medi-Cal.  Do not send claim to [facility]  -Authorization/Reference/Tracking Number: [id number] per [employer]  -Secondary Insurance: None  - Physician admit order: INPATIENT 05/16/2024 @ 1632  -Utilization review: Christopher NOVAK., RN [facility]: [phone] FAX: [phone]  =============Running Summary============================  05/16/2024/Inpatient/Acute -05/21/2024  05/22/2024-Discharged  --------------------------------------------------------------------------------      DISCHARGED SUMMARY 05/22/24:        Date of Admission:   05/16/2024  Date of Discharge:    05/22/2024     Hospital Problem List (required):      Active Hospital Problems     Diagnosis    *Depression, unspecified [F32.A]    Alcohol  use disorder, severe, dependence (CMS-HCC) [F10.20]       Resolved Hospital Problems   No resolved problems to display.         Additional Hospital Diagnoses (rule out or suspected diagnoses, etc.):  None     Principal Procedure During This Hospitalization (required):  None     Other Procedures Performed During This Hospitalization (required):  None     Procedure results are available in Chart Review in Epic.  For those providers external to [place], the key procedure results are listed below:  None     Consultations Obtained During This Hospitalization:  None     Key consultant recommendations:  NA     Reason for Admission to the Hospital:  Christopher [name] is a 33 year old male with history of bipolar disorder, alcohol  use disorder, atrial fibrillation, diabetes mellitus admitted for alcohol  intoxication and agitation.     The patient states that he can not remember what happened yesterday.  He knows that he was drinking and eventually got picked up by police being agitated. He drinks  regularly hard liquor. He knows that he has an alcohol  problem but he does not see a purpose in going to a rehab facility. Is in the process of trying to find a job and has been applying to remote jobs at [employer] and other institution but has not been successful so far. Things that by getting a job he would be able to stop drinking. He has been homeless but is currently living in an apartment that belongs to his mother. He denies suicidal or homicidal ideations and denies auditory or visual hallucinations.     Collateral information (mother Christopher [name], [phone]): The patient has been very ill for many months. He has been erratic and paranoid. He has been endorsing that he is part of a gang, which is not true. He has been acting differently and threatening to family members. He is driving recklessly and went to his grandmother, threatening her also. He has been having many episodes of suicidal threats. She is very scared that he will hurt someone. He acts grandiose and has no sense of reality. She knows that something really bad will happen.     Per interview with pt upon transfer to [facility]:     Christopher [name] was calm and cooperative during the interview, expressing remorse for what happened and hoping to get help going forward saying I can afford to have something like that happen again. He says that he feels well otherwise, maybe a little depressed but otherwise is goal/future oriented and fully engaged with seeking treatment. He denies any SI/HI/AVH.     Hospital Course  by Problem:     A. Psychiatric:   Upon arrival to unit, patient was observed and demonstrated to be dysphoric with significant withdrawal symptoms (tremors, diarrhea, tactile hallucinations, visual hallucinations) and his CIWA scored ranged from 11 - 8. He also demonstrated low mood and meet criteria for MDD. Patient was started on sertraline 50mg  for low mood titrated to 150mg . Patient was started on naltrexone 50mg  for alcohol  cravings. Patient  was started on gabapentin 300mg  TID for anxiety and withdrawal, titrated up to 900mg  TID. Patient was started on Trazodone 50mg  for sleep in the setting of insomnia. Patient was started on prazosin 2mg  for nightmares.  Patient was also provided with PRN Diazepam  for CIWA>8.  Patient was compliant with medications and reported no side effects.     Over the course of hospitalization, patient became less dysphoric, discussed a desire to receive substance use treatment services, connect with AA, discuss discharge planning with his family, coordinated to get primary care and a find an apartment with his mother's help. He open up about his significant feeling of depression for years and using alcohol  as a means to cope with his poor sleep and nightmares. He showed improvement on the medications, is reporting better sleep and an improved mood.      On day of discharge, patient was visible on unit, appropriate with staff, and was encouraged to participate in individual, group, occupational, and milieu therapies. Patient denied suicidal ideation, homicidal ideation, or audio/visual hallucinations. Patient was reported to be eating and sleeping well. We discussed the importance of medication adherence and staying sober as it would reduce the risk of more impulsive behaviors and rehospitalizations. Mother was called prior to discharge and was comfortable with patient's progress for discharge.        Patient is at low acute risk for imminent suicide has been interacting appropriately with peers and staff, and is future oriented, discussing plans for follow-up with outpatient services and future goals. The patient has a reasonable support system and plan of care, and denies access to a firearm. Patient was discharged in stable condition to prior residence. Patient was advised to follow up with their outpatient psychiatrist regularly, take medication as prescribed, and avoid drug use and alcohol  consumption for optimum mental  health. Patient states they are willing and able to seek help or return to the nearest ER should their condition worsen.         Audit-C Score: 11  4 or more is considered unhealthy alcohol  use in men.     The Centers for Disease Control and Prevention and [employer] on Aging are used as references to provide feedback to the patient regarding his/her alcohol  use in comparison to national norms.    http://www.cdc.gov/alcohol /fact-sheets/alcohol -use.htm   https://www.nia.nih.gov/health/publication/alcohol -use-older-people     Patient engaged in a brief intervention for alcohol  use. The patient engaged in a brief intervention that included the following components:     Feedback concerning the quantity and frequency of alcohol  consumed in comparison to national norms,  Discussion of the possible negative physical, emotional, social and occupational consequences of significant alcohol  use,  Discussion of the overall severity of patient's alcohol  use.     Based on this discussion patient is deemed to be in the contemplative stage of change regarding their alcohol  use.     Patient was engaged in a joint decision making process regarding their alcohol  use and plans for follow-up. Patient accepted referral for the following referrals: Alcoholics Anonymous.     B.  Medical:   # Alcohol  Withdrawal, resolved    # Diarrhea, resolved    - continue gabapentin 900 mg PO TID to manage withdrawal symptoms  - loperamide 2mg  PO q6h PRN diarrhea     # HTN  Patient was intermittently hypertensive on the unit. Suspect this was related to his alcohol  withdrawal.   - Continue Lisinopril 20mg  PO daily  - CTM BP - SBP continues to be mildly elevated in the 150s and DBP to 90s     # DM2  - Blood Glucose > 200 on two tests, A1C 7.4  - Metformin 500mg  PO BID     # Afib, SVT - ?Paroxysmal  - Previously on Apixiban 5mg  PO daily, unclear if taking it as pt can't remember all his medications  - EKG was normal on 10/13  - Follow up with PCP  outpatient regarding this     C. Legal: vol, previously on 5150 for DTS starting on 10/10      Comprehensive Risk Assessments at Discharge:  A comprehensive suicide risk assessment was performed and the patient was assessed to be at a low acute risk of self-harm due to the following factors:  - Suicidal ideation: Denies   - Plan: Denies  - Intent: Denies  - Suicidal/Self Harm Behaviors: Denies  - Modifiable risk factors: precipitating stressors and intoxication  - Non-modifiable risk factors: previous suicide attempts, existing psychiatric diagnoses, male gender, and single status  - Protective factors: future life plans, therapeutic relationships, and access to health care     A violence risk assessment was also performed. The following behavioral risk factors are associated with acute violence risk and have been present within the past 24 hours: none. Based on these factors, this patient's acute risk of violence in the inpatient setting in the next 24 hours is assessed to be low. Historical (non-modifiable) risk factors for violence in this patient include: prior violent acts.        Tests Outstanding at Discharge Requiring Follow Up:  None     Discharge Condition (required):  Stable.     Mental Status Examination at Discharge:    Appearance: age appearing male, sitting in dayroom watching TV, fair grooming/hygiene, wearing casual clothing  Behavior: calm, cooperative, good eye contact  Motor / abnormal involuntary movements: mild bilateral hand tremors when hands outstretched, no PMR/PMA  Gait: steady, independent  Speech: normal rate, volume, tone  Mood: so much better  Affect: euthymic  Thought process: coherent, logical  Associations: linear and goal-directed  Thought content: denies SI/HI, help seeking  Perceptions: no abnl  Insight / judgment: fair/fair  Sensorium: Level of consciousness awake; attentive; recent memory intact; remote memory intact  Orientation: Patient is oriented to person, place, time,  and situation  Intellectual Functions: Fund of knowledge average based on grammar and vocabulary; Interpretations abstract     Clinical Global Impression - Severity: 1= Normal, not at all ill      Key Physical Exam Findings at Discharge:  No significant physical examination findings at the time of discharge.     Discharge Diet:  Regular.     Discharge Disposition:  Home.           Discharge Code Status:  Full code / full care  This code status is not changed from the time of admission.     Discharge Medications:      What To Do With Your Medications          START taking these  medications         Add'l Info   folic acid  1 MG tablet  Commonly known as: FOLVITE   Take 1 tablet (1 mg) by mouth daily for 5 doses.  Start taking on: May 23, 2024    Quantity: 5 tablet  Refills: 0      gabapentin 600 MG tablet  Commonly known as: NEURONTIN  Take 1.5 tablets (900 mg) by mouth 3 times daily after meals.    Quantity: 135 tablet  Refills: 0      lisinopril 20 MG tablet  Commonly known as: PRINIVIL, ZESTRIL  Take 1 tablet (20 mg) by mouth daily.  Start taking on: May 23, 2024    Quantity: 30 tablet  Refills: 0      metFORMIN 500 mg XR tablet  Commonly known as: GLUCOPHAGE XR  Take 1 tablet (500 mg) by mouth 2 times daily.    Quantity: 60 tablet  Refills: 0      naltrexone 50 MG tablet  Commonly known as: DEPADE  Take 1 tablet (50 mg) by mouth daily.  Start taking on: May 23, 2024    Quantity: 30 tablet  Refills: 0      prazosin 2 MG capsule  Commonly known as: MINIPRESS  Take 1 capsule (2 mg) by mouth at bedtime.    Quantity: 30 capsule  Refills: 0      sertraline 50 MG tablet  Commonly known as: ZOLOFT  Take 3 tablets (150 mg) by mouth daily.  Start taking on: May 23, 2024    Quantity: 30 tablet  Refills: 0      thiamine  100 MG tablet  Commonly known as: VITAMIN B1  Take 1 tablet (100 mg) by mouth daily.  Start taking on: May 23, 2024    Quantity: 30 tablet  Refills: 0      traZODone 50 MG tablet  Commonly  known as: DESYREL  Take 1 tablet (50 mg) by mouth at bedtime.    Quantity: 30 tablet  Refills: 0                    Where to Get Your Medications          These medications were sent to [facility]  [address] Floor, [place] [address]        Hours: Mon-Fri: 8:30am-7:00pm; Sat-Sun & Holidays: 9:00am-5:00pm Phone: [phone]   folic acid  1 MG tablet  gabapentin 600 MG tablet  lisinopril 20 MG tablet  metFORMIN 500 mg XR tablet  naltrexone 50 MG tablet  prazosin 2 MG capsule  sertraline 50 MG tablet  thiamine  100 MG tablet  traZODone 50 MG tablet               Patient Discharging with >1 antipsychotic, including PRN antipsychotics: No     Allergies:  [Allergies]    [Allergies]  No Known Allergies     Follow Up Appointments:     [facility]  [address] [place] [address]  Tel: [phone], FAX: [phone].   Your initial visit will be on a walk-in basis.   Walk-in hours are from 8:30 am to 3:30 pm Monday through Friday.      Discharging Physician's Contact Information:  Neurobehavioral Medicine Unit ([facility] / Devora Cater) at [phone].     Time Spent - (for use by billing providers only)  30 minutes were spent in direct patient  care activities, discharge planning, and coordination of care.

## 2024-05-24 NOTE — Interdisciplinary (Signed)
 [facility] Social Work Note:    Patient came up to SW to request his prescriptions to be sent to [facility] in [place] at discharge tomorrow. He reported he discussed with MD to go home with family tomorrow, not to a crisis house.     Discussed the above with MD and RN. MD confirmed plan will be to discharge tomorrow to an apartment set up by family.     SW discussed aftercare options with patient. He declined formal referral but information provided in AVS and he was encouraged to visit the clinic on a walk-in basis. Sobriety resources had already been provided and discussed.     [facility]  Your initial visit will be on a walk-in basis.   Walk-in hours are Monday through Friday from 8:30 AM to 3:30 PM  [address] [place] [address]  Tel: [phone], FAX: [phone].

## 2024-06-02 ENCOUNTER — Other Ambulatory Visit: Payer: Self-pay

## 2024-06-03 ENCOUNTER — Other Ambulatory Visit (HOSPITAL_COMMUNITY): Payer: Self-pay

## 2024-06-03 ENCOUNTER — Other Ambulatory Visit: Payer: Self-pay

## 2024-06-03 DIAGNOSIS — F32A Depression, unspecified: Secondary | ICD-10-CM

## 2024-06-05 ENCOUNTER — Other Ambulatory Visit: Payer: Self-pay

## 2024-06-08 ENCOUNTER — Other Ambulatory Visit: Payer: Self-pay

## 2024-06-29 ENCOUNTER — Emergency Department (HOSPITAL_BASED_OUTPATIENT_CLINIC_OR_DEPARTMENT_OTHER)

## 2024-06-29 DIAGNOSIS — Z7984 Long term (current) use of oral hypoglycemic drugs: Secondary | ICD-10-CM | POA: Insufficient documentation

## 2024-06-29 DIAGNOSIS — Z79899 Other long term (current) drug therapy: Secondary | ICD-10-CM | POA: Insufficient documentation

## 2024-06-29 DIAGNOSIS — R0789 Other chest pain: Secondary | ICD-10-CM | POA: Insufficient documentation

## 2024-06-29 DIAGNOSIS — I440 Atrioventricular block, first degree: Secondary | ICD-10-CM | POA: Insufficient documentation

## 2024-06-29 DIAGNOSIS — R059 Cough, unspecified: Secondary | ICD-10-CM | POA: Insufficient documentation

## 2024-06-29 DIAGNOSIS — R0989 Other specified symptoms and signs involving the circulatory and respiratory systems: Secondary | ICD-10-CM | POA: Insufficient documentation

## 2024-06-29 DIAGNOSIS — R0602 Shortness of breath: Secondary | ICD-10-CM | POA: Insufficient documentation

## 2024-06-29 DIAGNOSIS — Z5900 Homelessness unspecified: Secondary | ICD-10-CM | POA: Insufficient documentation

## 2024-06-29 DIAGNOSIS — E119 Type 2 diabetes mellitus without complications: Secondary | ICD-10-CM | POA: Insufficient documentation

## 2024-06-29 LAB — TROPONIN T GEN 5 W/REFLEX TO CK/CKMB: Troponin T Gen 5 w/Reflex CK/CKMB: 9 ng/L (ref <22)

## 2024-06-29 LAB — COMPREHENSIVE METABOLIC PANEL, BLOOD
ALT (SGPT): 92 U/L — ABNORMAL HIGH (ref 0–50)
AST (SGOT): 57 U/L — ABNORMAL HIGH (ref 0–50)
Albumin: 4.6 g/dL (ref 3.5–5.2)
Alkaline Phos: 85 U/L (ref 40–129)
Anion Gap: 14 mmol/L (ref 7–15)
BUN: 14 mg/dL (ref 6–20)
Bicarbonate: 24 mmol/L (ref 22–29)
Bilirubin, Tot: 0.25 mg/dL (ref <1.2)
Calcium: 10.1 mg/dL (ref 8.5–10.6)
Chloride: 100 mmol/L (ref 98–107)
Creatinine: 0.78 mg/dL (ref 0.67–1.17)
Glucose: 111 mg/dL — ABNORMAL HIGH (ref 70–99)
Potassium: 4.2 mmol/L (ref 3.5–5.1)
Sodium: 138 mmol/L (ref 136–145)
Total Protein: 7.7 g/dL (ref 6.0–8.0)
eGFR Based on CKD-EPI 2021 Equation: >60 mL/min/1.73 m2

## 2024-06-29 LAB — SARS-COV-2, INFLUENZA A/B, AND RSV PCR, RRL
Influenza A PCR, RRL: NOT DETECTED
Influenza B PCR, RRL: NOT DETECTED
Respiratory Syncytial Virus (RSV) PCR, RRL: NOT DETECTED
SARS-CoV-2 PCR, RRL: NOT DETECTED

## 2024-06-29 LAB — CBC WITH DIFF, BLOOD
ANC-Automated: 2.5 1000/mm3 (ref 1.6–7.0)
Abs Basophils: 0 1000/mm3 (ref <0.2)
Abs Eosinophils: 0.1 1000/mm3 (ref 0.0–0.5)
Abs Lymphs: 2 1000/mm3 (ref 0.8–3.1)
Abs Monos: 0.3 1000/mm3 (ref 0.2–0.8)
Basophils: 0.4 %
Eosinophils: 2.8 %
Hct: 40 % (ref 40.0–50.0)
Hgb: 13.2 g/dL — ABNORMAL LOW (ref 13.7–17.5)
Imm Gran %: 0.2 % (ref <1)
Imm Gran Abs: 0 1000/mm3 (ref <0.1)
Lymphocytes: 39.2 %
MCH: 27.4 pg (ref 26.0–32.0)
MCHC: 33 g/dL (ref 32.0–36.0)
MCV: 83 um3 (ref 79.0–95.0)
MPV: 10.7 fL (ref 9.4–12.4)
Monocytes: 6.8 %
Plt Count: 216 1000/mm3 (ref 140–370)
RBC: 4.82 mill/mm3 (ref 4.60–6.10)
RDW: 12.7 % (ref 12.0–14.0)
Segs: 50.6 %
WBC: 5 1000/mm3 (ref 4.0–10.0)

## 2024-06-29 LAB — GLUCOSE (POCT): Glucose (POCT): 124 mg/dL — ABNORMAL HIGH (ref 70–99)

## 2024-06-30 ENCOUNTER — Emergency Department
Admission: EM | Admit: 2024-06-30 | Discharge: 2024-06-30 | Disposition: A | Discharge status: Home / Self Care | Attending: Emergency Medicine | Admitting: Emergency Medicine

## 2024-06-30 DIAGNOSIS — R0989 Other specified symptoms and signs involving the circulatory and respiratory systems: Secondary | ICD-10-CM

## 2024-06-30 DIAGNOSIS — R0789 Other chest pain: Secondary | ICD-10-CM

## 2024-06-30 DIAGNOSIS — E119 Type 2 diabetes mellitus without complications: Secondary | ICD-10-CM

## 2024-06-30 DIAGNOSIS — R059 Cough, unspecified: Secondary | ICD-10-CM

## 2024-06-30 DIAGNOSIS — Z5902 Unsheltered homelessness: Secondary | ICD-10-CM

## 2024-06-30 LAB — HCV ANTIBODY WITH REFLEX QUANT: Hepatitis C Ab: NONREACTIVE

## 2024-06-30 LAB — GLUCOSE (POCT): Glucose (POCT): 94 mg/dL (ref 70–99)

## 2024-06-30 LAB — ECG 12-LEAD
ATRIAL RATE: 62 {beats}/min
P AXIS: 24 degrees
PR INTERVAL: 212 ms
QRS INTERVAL/DURATION: 112 ms
QT: 388 ms
QTc (Bazett): 393 ms
QTc (Fredericia): 392 ms
R AXIS: -19 degrees
T AXIS: 4 degrees
VENTRICULAR RATE: 62 {beats}/min

## 2024-06-30 LAB — HIV 1/2 ANTIBODY & P24 ANTIGEN ASSAY, BLOOD: HIV 1/2 Antibody & P24 Antigen Assay: NONREACTIVE

## 2024-06-30 MED ORDER — METFORMIN HCL 500 MG OR TB24
500.0000 mg | ORAL_TABLET | Freq: Two times a day (BID) | ORAL | 0 refills | Status: AC
Start: 2024-06-30 — End: ?

## 2024-06-30 NOTE — Interdisciplinary (Addendum)
 06/30/24 1222   Assessment   Assessment Type Discharge;TST (Transitional Support Team) -[place] only   Referral Information   Consult Type Community Resources/Referrals;Homeless   Social Assessment   Where was the patient admitted from? * Homeless   Mode of Gaffer   Prior to Level of Function * Independent with ADL's   Assistive Device * Not applicable   Primary Caretaker(s) * Self   Primary Family/Caregiver Contact Name, Number and Relationship Christopher [name] Christopher [name] MOTHER [phone]   Is Patient Minor? No   Interpreter Used? Not Needed   Living Arrangements on Admission* Alone   Post Acute Resources Provided Drug/Alcohol  recovery;Housing Assistance Programs;Shelter;Food Banks;Community Clinic;Local Chief Technology Officer for [employer] and Food Stamps   A List of Production Designer, Theatre/television/film Provided Yes   Available Assistance/Support System * Family member(s)   Transportation*  Bus Pass   Patient Engaged in Discharge Planning * Yes   Primary Care Access Assigned PCP   Income Information   Income Source Adult Nurse Resources Medi-Cal   Do you have difficulty affording your medications * No   Audit-C Questionnaire    Do you drink alcohol ?  yes   How often do you have a drink containing alcohol ? 3   How many standard drinks containing alcohol  do you have on a typical day? 2   How often do you have six or more drinks on one occasion? 3   Audit-C Score 8   Substance Abuse History hx of ETOH, but says he has not been daily drinking and denies being in withdrawal.   Referral To   [facility] Substance Use Disorder program   Plans/Interventions/Discharge   Plan/Interventions Resources given;Explore needs and options for aftercare, provide referrals;Provide education & resources for substance/alcohol  abuse       Chief Complaint   Patient presents with    Shortness of Breath       C/o chest tightness since being dc'd from the hospital x2 days. Hx of afib and diabetes.        SW requested by EDMD regarding resources for housing and AOD programming.  Informed pt has hx of ETOH use.  Informed by MD that pt is medically cleared for DC.      EMR reviewed which reflects several recent ER visits at outside hospitals as well as recent SW notations from [facility]      SW met with pt in hallway bed E2.  Spoke with pt for about 45 mins.  Pt shares that he has been without housing for a few months.  Was living in [place] area.  Recently was at [employer]  Pt was DCed to a crisis house ([place]?) vs [facility] A[facility] program ?.  Unclear report as pt can not recall.  Pt states that he walked out the same day he was referred there from [place].  Pt reports that he was living in his car until about 2 weeks ago.  States that he no longer has the car.  Talks of conflict with his family.  They live in [place], per his report.  States he spoke to his family prior to coming to ER.  He was encouraged to contact his family in this meeting, reports that has a cell phone and can make contact without assistance if needed.       SW spoke to pt about shelter based housing resources.  Pt voiced having visited [employer] shelter downtown  while taking public transportation.  Discussed other shelters such as [facility] and pathway to enter if interested.  He does appears to know which resources and programs are available to him from having spoken with other SWs or SUDs at other hospitals, per his report.  Has no income when asked but desires a SLF.  Does get EBT.  Desires his own SLF or something where he can leave and come as he wishes and look for work.          SW spoke to pt about AOD programming resources and programs.  Pt reports that he does not drink daily when asked.  Denies that he is in withdrawal from ETOH.      Offered to make calls to [employer] vs [facility] vs [employer] vs alt community based programs ([employer], [facility], [facility] etc) to see about bed  availability for direct referral.  Pt reports that he had been at [facility] before and left same day he arrived there.        Pt voiced that he wants to just get back to work and those programs don't let you work.  States that he will accept resources but did not want to try and go direct today when offered to make some calls to programs on his behalf which he will need to reflectively interview for.      His request in the end was for the resources only vs calls, and to have a bus pass - which was provided to him.   Discussed a referral to TST as he has a phone for follow up for program linkage.           - Supportive talk provided. Information gathered.    - Pt referred to TST for potential follow up, if interested.  - Pt recently at crisis house and [place] - reports leaving day of arrival.    - Pt was living in car, now no car.  Homeless, outdoors. No income. Has EBT.    - Resources for AOD programming, such as [place]/[facility] provided.   Appears ambivalent about going into a shared living or AOD program such as [facility] or [facility].   - Resources for [facility] programming provided.  Pt did not want direct referral.   - Encouraged to connect with his family.  He has cell phone.   - Voiced wanting to be discharged with resources vs attempt at direct connection.   - Provided with transit fair at his request.  Return precautions discussed.     Discharged by EDMD [place] which were placed prior to SW concluding meeting with pt.  Resources provided in AVS as discussed with pt.  Pt thanked this SW for this discuss and encouraged to get connected to outreach or direct programming.

## 2024-06-30 NOTE — ED Provider Notes (Signed)
 ED Provider Note  [place] electronic medical record reviewed for pertinent medical history.     Christopher [name] DOB: 17-Oct-1990 PMD: [facility]     ED Arrival Information       Expected   06/29/2024     Arrival   06/29/2024 19:28    Acuity   Urgent/ESI Level 3              Means of arrival   Automobile    Escorted by   -    Service   Emergency Medicine    Admission type   Emergency              Arrival complaint   SHORTNESS OF BREATH;     C/o chest tightness since being dc'd from the hospital x2 days. Hx of afib and diabetes.             Chief Complaint   Patient presents with    Shortness of Breath     C/o chest tightness since being dc'd from the hospital x2 days. Hx of afib and diabetes.        HPI: Christopher [name] is a 33 year old male with PMH significant for T2DM, Afib, alcohol  use disorder, homelessness who presents to [place] ED for evaluation of shortness of breath and chest tightness. Patient is homeless, reports feeling unwell with cough, congestion, shortness of breath and chest tightness. Denies recent fevers, chills, leg pain/swelling, palpitations, nausea, vomiting, diarrhea, black/bloody stools. Requesting crisis house placement. No SI, HI, AH, or VH.      [place] electronic medical record reviewed for pertinent medical history.   Other independent historian: patient and others as documented in HPI (including family/friends, EMS, hospital staff)  I have reviewed external records: yes - including chart review, review of paper records accompany patient (if present, as documented in HPI)      Pertinent Medical History:    PMHx: As above    Past Surgical History[1]    Family History[2]    Current Outpatient Medications   Medication Instructions    folic acid  (FOLVITE ) 1 mg, Oral, DAILY    gabapentin  (NEURONTIN ) 600 MG tablet Take 1 and a HALF tablets (900 mg) by mouth 3 times daily after meals.    lisinopril  (PRINIVIL , ZESTRIL ) 20 mg, Oral, DAILY    metFORMIN   (GLUCOPHAGE  XR) 500 mg, Oral, 2 TIMES DAILY    naltrexone  (DEPADE) 50 mg, Oral, DAILY    prazosin  (MINIPRESS ) 2 mg, Oral, AT BEDTIME    sertraline  (ZOLOFT ) 150 mg, Oral, DAILY    thiamine  (VITAMIN B1) 100 mg, Oral, DAILY    traZODone  (DESYREL ) 50 mg, Oral, AT BEDTIME       Physical Exam  BP 112/74 (BP Location: Left arm, BP Patient Position: Sitting)   Pulse 56   Temp 98.4 F (36.9 C)   Resp 16   Ht 6' 4 (1.93 m)   Wt 131.7 kg (290 lb 5.5 oz)   SpO2 98%   BMI 35.34 kg/m   Physical Exam  Constitutional: sleeping but easily arousable, well appearing, NAD, disheveled  HENT: Normocephalic, atraumatic. Nose and external ears normal.   Eyes: Conjunctiva normal.   Neck: Normal ROM.   Cardiovascular: Normal rate, regular rhythm. No murmurs, rubs, or gallops. Pulses 2+. Capillary refill <2s.  Pulmonary: Effort normal. Breath sounds normal, no wheezes, rales, or rhonchi.   Abdominal: Flat, soft, no TTP.   Musculoskeletal: Normal ROM, no deformities. Gait  wnl.  Skin: Warm, dry. No rashes.  Neurological: Oriented x3, at baseline. No focal deficits.  Psych: flat affect. No SI.         Orders/Medications  Initial workup includes orders listed here, with significant results in ED course below:  Orders Placed This Encounter   Procedures    X-Ray Chest Frontal And Lateral    CBC w/ Diff [place]    HIV 1/2 Antibody & P24 Antigen Assay    Troponin T Gen 5 w/Reflex to CK/CKMB Green Plasma Separator Tube    Comprehensive Metabolic Panel - For patients 48 years old and older    GLUCOSE (POCT)    GLUCOSE (POCT)    ECG 12 Lead       Initial treatment included:  Medications - No data to display      Data Reviewed:  CXR (interpreted by me): negative for acute findings  EKG (interpreted by me): sinus rhythm with 1st degree AVB rate 62 bpm. Normal axis. Normal intervals. No ST elevations     Medical Decision Making:  In summary, this is a 33 year old male with PMH significant for T2DM, Afib, alcohol  use disorder, homelessness who  presents to [place] ED for evaluation of shortness of breath and chest tightness. Patient is homeless, reports feeling unwell with cough, congestion, shortness of breath and chest tightness. Denies recent fevers, chills, leg pain/swelling, palpitations, nausea, vomiting, diarrhea, black/bloody stools. Requesting crisis house placement. No SI, HI, AH, or VH.     The patient has been hemodynamically stable and afebrile since arrival to the ED. On exam, patient is well-appearing, not in any respiratory distress, no cardiopulmonary abnormalities. No warning signs of systemic infection (fevers, tachypnea) to suggest pneumonia, and lung sounds clear on exam. No photophobia or neck stiffness/pain to suggest meningitis. No rash. Initial workup included fluco swab, CBC, CMP, troponin, influenza/COVID, EKG, CXR.     Updates: Patient tested negative for COVID and influenza. Labs reassuring as below. The patient is well appearing with stable vital signs, no respiratory distress, clear lungs.  Patient was not hypoxic during their stay. Doubt PE given risk factors and description of symptoms. Doubt complication such as bacterial pneumonia or pneumothorax. For these reasons, the patient is appropriate for outpatient management.   Patient was re-assessed by EDMD, and at that time was resting comfortably. Agreeable with plan for discharge.     Please see workup review below for ED course, updates regarding laboratory and imaging findings, as well as disposition.  ED Course:  ED Course as of 07/06/24 0725   Stuart Fish Caroline's Documentation   Wed Jun 30, 2024   0723 WBC: 5.0  No leukocytosis. Mild anemia   0723 Comprehensive Metabolic Panel - For patients 31 years old and older(!)  Normal renal function. No significant electrolyte abnormalities. Mild transaminitis   0953 Troponin T Gen 5: 9  wnl   0953 SARS-CoV-2 PCR, RRL: Not Detected   0953 Influenza A PCR, RRL: Not Detected   0953 Influenza B PCR, RRL: Not Detected   0953  Respiratory Syncytial Virus (RSV) PCR, RRL: Not Detected      Others' Documentation   Wed Jun 30, 2024   9156 33yo homelessness crisis house drug use cxr and trop done [RS]      ED Course User Index  [RS] Lum Lavern LABOR, MD         ED Clinical Impression as of 07/06/24 0725   Cough, unspecified type   Unsheltered homelessness  SDOH:  Dx/tx limited by SDOH: Problems related to housing and economic: Problems related to housing Homelessness. These factors potentially limit the patient's ability to obtain adequate follow up and/or comply with treatment.      Dispo: discharged home      Risk of Complications and/or Morbidity:  Risk - Drugs : Prescription drug management      ED Diagnoses:  Discharge  1. Cough, unspecified type        2. Unsheltered homelessness        3. Type 2 diabetes mellitus without complication, without long-term current use of insulin   metFORMIN  (GLUCOPHAGE  XR) 500 mg XR tablet        Discharge Medication List as of 06/30/2024 12:21 PM          Patient seen and discussed with attending, Lum Living, MD      Taje Tondreau C. Stuart, MD  Resident Physician, PGY4  Emergency Medicine  [facility]      NOTE: Labs/meds/vitals above auto-refreshed with most recent information at time of signing this note. Unless otherwise noted, all MDM and evaluation by this writer ended with results available at [facility] and significant changes in management plans and /or hospital course may have occurred. See available notes either by this clinical research associate or other providers after Dispo for further information.     Voice recognition software may have been used in the preparation of this note, therefore you may see significant substitutions or omissions despite careful editing.         [1] No past surgical history on file.  [2] No family history on file.       Stuart Mardy Fitch, MD  Resident  07/06/24 [id number]       Lum Living LABOR, MD  07/07/24 [phone]

## 2024-06-30 NOTE — Discharge Instructions (Addendum)
 Thank you for visiting the [facility]. Your evaluation has not indicated the presence of a medical emergency at this time. However, you will need to follow-up with your primary care provider in 1-3 days time for repeat evaluation. If you do not have a PCP on file, a referral is included here with your discharge paperwork. The follow-up appointment is a very important part of your ongoing medical evaluation and management and should not be skipped. Return to the Emergency Department for any new or concerning symptoms, I.e. severe headache, worsening neck pain, numbness/tingling/weakness in your upper or lower extremities, difficulty walking, worsening chest pain, difficulty breathing, high fever, severe abdominal pain, uncontrollable bleeding or vomiting. Continue to take all prescribed medications as instructed.     SUBSTANCE ABUSE RESOURCES and [name]    [facility] Access/Referral Line - Alcohol  & Drug Info/Rehab  [phone]    Outpatient Detox    [facility] [phone], [place] - Alcohol  and Rehab     [name] Detox [phone]  [address], [place] - Heroin and Opiate    [name] [phone]  [address]  [place]opher [name], [place] [address]    [name] Detox [phone]  [address], [place] [name] Inc Detox [phone]  [address], [place]    Choices in Recovery [phone]  [address] [place], [place] - Alcohol  and Opiate     [facility] [phone]  [address], [place] - Heroin     [name] Drug & Alcohol  Program [phone]  [address], [place] Ree     Inpatient Detox    [name]  Treatment [facility]tr [phone]  [address] [place], [place] [phone]  [address] Christopher [name] [phone]  [address], [place]     [name] [place] [phone]  [phone] of [place], [place]      Outpatient Rehab    [facility] Outpatient Treatment [phone]  [address] [place], [address], [place]    [name] [phone] [name]     [name] [phone]  [address], [place]     Stepping [facility] [phone]  [address], [place]     [name] [phone] 3rd [place] [name] [name] LLC     [name] [phone]  [address] , [name]     [name] Recovery [name] [phone] [address], [place]    [facility] [phone]  [address] [place]     Residential Rehab    CO-ED    [place] [place] of [place] [phone]  Multiple Locations     CRASH [phone]  [name] [place] [facility] [place] [phone]  [address] [place] [phone]  [address], [place] [name] [place]     MEN    [place] [name] - [place] [phone]  [address], [place]     Pathfinders [phone]  [address] [name]     It Sales Professional - Work Program [phone]  [address] [name], [place]     [place] [phone]  [name] [name] LLC [address],  [name]     WOMEN    Crossroads [phone]  [address], [name] - [place] [phone]  [address] [name] [phone]  [address], Eleanore Milian     Pathmark Stores Door of [place] [phone]  [phone] Health [name] [address], Delia [place]     Sober Living    (Usually need income for this housing)  [name] House [phone]  [name]     [place] Living Coalition Hotline [phone]    Self Help    Alcoholics Anonymous [phone]  [name] # [phone]    Narcotics Anonymous [phone]  Toll Free # [phone]    Alanon/Alateen [phone]     START programs below are available to you.     [name]  [address] [place]  [address]  TEL: [phone]  FAX: [phone]    [place] [name]  [address] [place] [address]  TEL: [phone]  FAX: [phone]    [facility]  [address] [place] [address]  TEL: [phone]   FAX: [phone]    [name]  [address] [place] [address]  TEL: [phone]   FAX: [phone]    [name] [place]  [address] [place] [address]  TEL: [phone]   FAX: [phone]    [name] [name]  [address] [place] [address]  TEL: [phone]   FAX: [phone]    [name]  [address]  [place], [place] [address]  TEL: [phone]  FAX: [phone]    [name] Resources  Info Line: [phone] or [phone] 2-1-1 [name] is a trusted, magazine features editor with connections to estée lauder, health, disaster services and so much more. With one call will connect you to resources and services.     [address] es una organizacin sin fines de visual merchandiser reconocida que conecta a los habitantes de [place] con ms de 6.000 recursos comunitarios, mdicos y en caso de [place]. El servicio de llamadas al 2-1-1 es gratuito y est disponible en ms de 200 idiomas, las 24 horas del da, los 365 [address]    Access and Crisis Line: [phone]    Public Benefits & Case Management Sites    CalFresh (FOOD STAMPS)   [name] For Children [address] [name]   [phone]   Eligible homeless individuals can walk-in for help.     Emergency [employer]   [address]   [phone] Ext: [phone] or [phone]   Case Management Services     Family Self [facility] [name]   [address]   [phone]   Case Management Services     Medi-Cal Help, CalWORKS Child Welfare, Family Resource [name]   [name] Health and Health And Safety Inspector Agency)   [address]   7a 5p Monday Friday   Eligible homeless individuals can walk-in for  help.     ------------------------------------------------------------------------     Shelter Resources    [name] [name] LP [employer]   [address] [place]    [phone]  Men, Women, Families. Call for more information    [name]   [phone]  Annella Estimable   [phone]   Single adult females    Shelter Services [facility] [facility]   [address]   [phone]   Families, Singles, and [employer] or walk-in for more information.     STEPS Program [facility]   [address]   [phone]     [name]  [address] & [place]   [phone]    ------------------------------------------------------------------------     Homeless Day/ Drop-In Centers    [facility] [name]   [address]   [phone]   Homeless Individuals   Mon-Fri 8:00 am 12: 00 pm, 1:00 pm 3:30 pm   Sat Sun 8:00 am 11:00 am, 12:00 pm 3:30 pm     [name]   [address]   [phone]   Open daily from 7:00 am 5:00 pm   Women Only.     Storefront [address] & Community Services   [phone] or [phone]   Youth ages 63 to 70     The [name] Tem[facility] Partnership Unhoused Care Team is the new lead service provider for The Hub's operations at [employer] (5th?floor). Hours of service will be evaluated regularly to best serve client needs. The following schedule is subject to change. Please check this website for the most current schedule.  To Schedule an Appointment:  Services and meetings with a resource navigator are now available by appointment six days a week.  Visit?[email or web address]  OR   Call 2-1-1 for information and referral to The Hub. A Hub Navigator will then reach out (as soon as 24 hours depending on call volume) to assist you.  OR   Visit the Hub Monday-Saturday between 1:00 p.m.-3:00 p.m.  Contact  Are you looking to connect a person experiencing  homelessness to resources at [employer] or do you have general questions related to available services??Please contact us  at?[email or web address]  The Hub provides a broad range of services to assist unhoused individuals and families navigate their path to permanent or other long-term housing.   System Navigation Services:?One-time or short-term case management to coordinate activities related to moving someone from homelessness to permanent, temporary, or longer-term housing. This can include but is not limited to providing support with housing applications and searches, employment readiness, basic needs support and/or connections to other helpful community events and resources.   Family Reunification and Diversion Services:?Identify safe and immediate alternative housing options to guide individuals and families away from the homelessness response system.    ------------------------------------------------------------------------     Meal Sites     Bread Day Program [facility]) Third [name] Organization   [address]   [phone]   Monday 4:00 pm, Friday 9:00 am     [place] [name] [name] & [name] Of [place]   [address] [name]   [phone]   Seniors 60 and older only   Mon-Fri 6:30 am 4:00 pm   Sat. 7:00 am 2:30 pm   Sun. 8:00 am 2:30 pm   Call for meal times.     [employer]   [address]   [phone]   Thurs. 5:00 pm 6:00 pm     [employer] of [place]   [address]   [phone]   Sun 2:30 pm     [facility]   [phone]  Avenue   [phone]   Seniors 55 and older.   Breakfast Served Mon-Fri 8:30 am 11:00 am   Dinner Line up [employer] 6:15 pm     [name]. [facility]   [address]   [phone]   Daily 10:30 am     ------------------------------------------------------------------------     Clothing & Other Homeless Services     [name]   [address]   [phone]     [name]   [address]   [phone]     [name]   [address]   [phone]     Stand Up For Kids   [address]   Youth ages 9-22   [phone]     [employer].net   [phone]     ------------------------------------------------------------------------     Public Restrooms     [address]   [address]     ------------------------------------------------------------------------     Dillard's Health    Health Care for the [place] [facility] [name] at Connections   [address]   [phone]     Health Services [facility] [place]   [address]   [phone] Ext. [phone]   [phone]   (appointments preferred)     [name] LP [name] LLC   [address]  [phone]     STD Testing, Counseling and Treatment [facility]   2017 1st [address]   [phone]     Friend to [name]) Program [employer]   [phone]   Call for location information     The [facility]   [address]   [phone]     [facility] Region Urgent Walk-In Services [facility] [name]   [address]   [phone]     ------------------------------------------------------------------------     Hotline and Legal Service    Access and Crisis Hotline   [phone]   Mental Health 24 hours     Aging and Independence Services (AIS)   [phone]   Older Adults/Disabled Abuse     Consumer [name] for Health Education and Advocacy   [phone]     [place] of [place] Wellpoint   [phone]   Public Benefits     Disability Help [name]   [phone]   SSI/SSDI/VA Claims     Hope Line [name] [address] [name]   [phone]   [phone] [place] [name] Line)   Domestic Violence and Curator Aid Society of [place]   [phone]    Legal Assistance     National Suicide Prevention Hotline   615-165-1024   Suicide Prevention     Homeless Outreach Team (H.O.T.)   [phone]   Meets at [address]   Mon-Fri 6:30 am 8:30 am     Please utilize the [name] [employer], [phone], 24 hours/7 days per week for psychiatric emergencies and referrals.    The Homelessness Response [name]), now known as The Hub, is open.  The [name] Inc Partnership Unhoused Care Team is the new lead service provider for The Hub's operations at The [name] [name] LLC (5th floor). Hours  of service will be evaluated regularly to best serve client needs. The following schedule is subject to change. Please check this website for the most current schedule.  To Schedule an Appointment  The Homelessness Response [name]), now known as The Hub, is open.  Services and meetings with a resource navigator are now available by appointment six days a week.  Visit [email or web address]  OR  Call 2-1-1 for information and referral to The Hub. A Hub Navigator will then reach out (as soon as 24 hours depending on call volume) to assist you.  OR  Visit the Hub Monday-Saturday between 1:00 p.m.-3:00 p.m.  Contact  Are you looking to connect a person experiencing homelessness to resources at [employer] or do you have general questions related to available services? Please contact us  at [email or web address]  Overview of Services available at [employer] provides a broad range of services to assist unhoused individuals and families navigate their path to permanent or other long-term housing.  System Navigation Services: One-time or short-term case management to coordinate activities related to moving someone from homelessness to permanent, temporary, or longer-term housing.This can include but is not limited to providing support with housing applications and searches, employment readiness, basic needs support and/or connections to  other helpful community events and resources.  Family Reunification and Diversion Services: Identify safe and immediate alternative housing options to guide individuals and families away from the homelessness response system.  More Resources  [place] of [address] Shelter Resources  Outreach teams near you can refer you to homelessness shelters in the [place] of University.  Call 2-1-1 and ask to be referred to a [place] of 204 Medical [name]. An Diplomatic Services Operational Officer will connect with you.  Coordinated Entry Access Sites  Visit: cardcollectible.com.ee- for contact information for organizations that are a starting point for getting connected to available housing resources. They are referred to as "Access Sites." Each one differs in what it can provide. It is recommended that you call ahead to learn more about the options available at the Access Site before visiting their physical location.  Connecting with an Access Site does not guarantee you will receive direct assistance with housing or shelter. It is important that you stay in touch with the Access Site regularly by updating your contact information or reporting a major change in your situation.  Help with Your Rent  If you are experiencing housing instability or are at imminent risk of homelessness in the [place] of [address], call the [facility] Housing Commission's HOUSING FIRST - [place] Hotline at ([phone]. Staff will assess your housing crisis and determine if there are any programs that can help you based on the eligibility criteria for the program.  [name] [place] Prevention Program  Call 219 723 4464 LEGAL AID (603-235-8135) or email Info@lassd .org. The [employer] Program helps renters with low income in the [place] of [place] who are facing eviction for not paying their rent.  Section 8 Housing Choice Voucher Rental Assistance  Visit debtride.com.au or call [phone] Monday - Friday, 8:30 a.m. - 4 p.m. You can apply  for Section 8 Housing Choice Voucher rental assistance from [facility], check your status on the waiting list or update your information.  Affordable Housing  Visit the "Find [name] Rental Properties" (antiagingalternatives.com.cy.aspx) page accessible through [name] website [name] Affordable Housing List to search for and apply to live at properties [name] [name] [name] owns. These housing units that offer rents affordable for households with low income.  View the [employer]. (Clubmonetize.fr) A list of citywide affordable housing options starts on Page 10. Please contact the properties directly to ask about availability and eligibility criteria. Their contact information is listed on the guide.  Food Assistance  There are food bank resources throughout the [place]. Go to [facility] [name] [employer] (http://www.bradshaw.com/) to locate a food apache corporation near you.  [address] of [address] Health & [name] Doctor (HHSA) Family Resource Centers (rewardupgrade.com.cy.html)  Go to the [name] Family [employer] List to see locations and services offered by [facility] throughout the [place].    Please utilize the [name] Air Products And Chemicals, [phone], 24 hours/7 days per week for psychiatric emergencies and referrals.

## 2024-06-30 NOTE — ED Notes (Signed)
 Pt given SW resources, dressed appropriately , Metformin  sent to Pharmacy on file, steady gait, Bus pass given, food given

## 2024-06-30 NOTE — ED Notes (Signed)
SW consult at bedside

## 2024-07-06 ENCOUNTER — Telehealth (HOSPITAL_COMMUNITY): Payer: Self-pay

## 2024-07-06 NOTE — Telephone Encounter (Signed)
 TST Specialist called pt, LM on VM.

## 2024-07-07 NOTE — Telephone Encounter (Signed)
 TST Specialist called pt, LM on VM.
